# Patient Record
Sex: Female | Born: 1970
Health system: Southern US, Community
[De-identification: ages and names within clinical notes are randomized; demographics above are authoritative.]

## PROBLEM LIST (undated history)

## (undated) DIAGNOSIS — E559 Vitamin D deficiency, unspecified: Principal | ICD-10-CM

## (undated) DIAGNOSIS — R0602 Shortness of breath: Secondary | ICD-10-CM

## (undated) DIAGNOSIS — G4486 Cervicogenic headache: Secondary | ICD-10-CM

## (undated) DIAGNOSIS — R51 Headache: Secondary | ICD-10-CM

## (undated) DIAGNOSIS — M1990 Unspecified osteoarthritis, unspecified site: Secondary | ICD-10-CM

## (undated) DIAGNOSIS — T7840XA Allergy, unspecified, initial encounter: Secondary | ICD-10-CM

## (undated) DIAGNOSIS — Z800 Family history of malignant neoplasm of digestive organs: Secondary | ICD-10-CM

## (undated) DIAGNOSIS — K219 Gastro-esophageal reflux disease without esophagitis: Secondary | ICD-10-CM

## (undated) HISTORY — DX: Allergy, unspecified, initial encounter: T78.40XA

## (undated) HISTORY — DX: Headache: R51

## (undated) HISTORY — DX: Vitamin D deficiency, unspecified: E55.9

## (undated) HISTORY — DX: Gastro-esophageal reflux disease without esophagitis: K21.9

## (undated) HISTORY — DX: Family history of malignant neoplasm of digestive organs: Z80.0

## (undated) HISTORY — DX: Unspecified osteoarthritis, unspecified site: M19.90

## (undated) HISTORY — DX: Shortness of breath: R06.02

## (undated) HISTORY — DX: Cervicogenic headache: G44.86

---

## 2008-10-09 ENCOUNTER — Emergency Department (HOSPITAL_COMMUNITY): Admission: EM | Admit: 2008-10-09 | Discharge: 2008-10-09 | Payer: Self-pay | Admitting: Emergency Medicine

## 2009-05-28 ENCOUNTER — Encounter: Admission: RE | Admit: 2009-05-28 | Discharge: 2009-05-28 | Payer: Self-pay | Admitting: Obstetrics and Gynecology

## 2009-09-24 ENCOUNTER — Encounter: Payer: Self-pay | Admitting: Family Medicine

## 2009-10-22 ENCOUNTER — Ambulatory Visit: Payer: Self-pay | Admitting: Family Medicine

## 2009-10-22 DIAGNOSIS — K219 Gastro-esophageal reflux disease without esophagitis: Secondary | ICD-10-CM | POA: Insufficient documentation

## 2009-10-22 DIAGNOSIS — R109 Unspecified abdominal pain: Secondary | ICD-10-CM | POA: Insufficient documentation

## 2009-10-29 LAB — CONVERTED CEMR LAB
ALT: 10 units/L (ref 0–35)
AST: 14 units/L (ref 0–37)
Albumin: 3.9 g/dL (ref 3.5–5.2)
Amylase: 116 units/L (ref 27–131)
Basophils Absolute: 0 10*3/uL (ref 0.0–0.1)
CO2: 26 meq/L (ref 19–32)
Chloride: 106 meq/L (ref 96–112)
Eosinophils Absolute: 0.1 10*3/uL (ref 0.0–0.7)
GFR calc non Af Amer: 192.16 mL/min (ref >60)
Glucose, Bld: 77 mg/dL (ref 70–99)
HCT: 39.2 % (ref 36.0–46.0)
Hemoglobin: 13.6 g/dL (ref 12.0–15.0)
Monocytes Relative: 5.9 % (ref 3.0–12.0)
Neutro Abs: 4.9 10*3/uL (ref 1.4–7.7)
Neutrophils Relative %: 66 % (ref 43.0–77.0)
Sodium: 137 meq/L (ref 135–145)
Total Bilirubin: 0.5 mg/dL (ref 0.3–1.2)
WBC: 7.5 10*3/uL (ref 4.5–10.5)

## 2010-01-15 ENCOUNTER — Ambulatory Visit: Admit: 2010-01-15 | Payer: Self-pay | Admitting: Family Medicine

## 2010-01-27 ENCOUNTER — Encounter: Payer: Self-pay | Admitting: Pediatrics

## 2010-02-05 NOTE — Assessment & Plan Note (Signed)
Summary: new to estab/cbs   Vital Signs:  Patient profile:   40 year old female Menstrual status:  regular LMP:     08/21/2009 Height:      65 inches Weight:      128.2 pounds BMI:     21.41 Temp:     98.6 degrees F oral Pulse rate:   80 / minute Pulse rhythm:   regular BP sitting:   90 / 58  (left arm) Cuff size:   regular  Vitals Entered By: Almeta Monas CMA Duncan Dull) (October 22, 2009 2:16 PM) CC: NEW EST CARE.Marland KitchenMarland KitchenPt is pregnant due date is 05/26/2010 LMP (date): 08/21/2009     Menstrual Status regular Enter LMP: 08/21/2009   History of Present Illness: Pt here to establish and c/o reflux problems ---pt is [redacted] weeks pregnant and had problem with reflux even before she was pregnant.  Pt also c/o R LQ pain that has been there for years and she was told by Dr it was scar tissue.  Pt is very worried about pancreatic cancer.  Pt was taking Tums at suggestion of OB it helps but the flavor makes her sick.     Preventive Screening-Counseling & Management  Alcohol-Tobacco     Smoking Status: never  Caffeine-Diet-Exercise     Does Patient Exercise: yes      Drug Use:  no.    Current Medications (verified): 1)  Prenatal Vitamins 0.8 Mg Tabs (Prenatal Multivit-Min-Fe-Fa) .Marland Kitchen.. 1 By Mouth Once Daily 2)  Zantac 150 Mg Tabs (Ranitidine Hcl) .Marland Kitchen.. 1 By Mouth Two Times A Day  Allergies (verified): No Known Drug Allergies  Past History:  Family History: Last updated: 10/22/2009 Family History of Arthritis-- Mother Family History Diabetes 1st degree relative-- Mother Family History High cholesterol-- Mother Family History Hypertension--Mother Paunt--pancreatic ca  Social History: Last updated: 10/22/2009 Married Never Smoked Alcohol use-no Drug use-no Regular exercise-yes  Risk Factors: Exercise: yes (10/22/2009)  Risk Factors: Smoking Status: never (10/22/2009)  Past medical, surgical, family and social histories (including risk factors) reviewed for relevance to  current acute and chronic problems.  Past Medical History: GERD  Past Surgical History: Caesarean section--1998  Family History: Reviewed history and no changes required. Family History of Arthritis-- Mother Family History Diabetes 1st degree relative-- Mother Family History High cholesterol-- Mother Family History Hypertension--Mother Paunt--pancreatic ca  Social History: Reviewed history and no changes required. Married Never Smoked Alcohol use-no Drug use-no Regular exercise-yes Smoking Status:  never Drug Use:  no Does Patient Exercise:  yes  Review of Systems      See HPI  Physical Exam  General:  Well-developed,well-nourished,in no acute distress; alert,appropriate and cooperative throughout examination Lungs:  Normal respiratory effort, chest expands symmetrically. Lungs are clear to auscultation, no crackles or wheezes. Heart:  normal rate and no murmur.   Abdomen:  Bowel sounds positive,abdomen soft and non-tender without masses, organomegaly or hernias noted. Psych:  Oriented X3 and normally interactive.     Impression & Recommendations:  Problem # 1:  GERD (ICD-530.81)  Her updated medication list for this problem includes:    Zantac 150 Mg Tabs (Ranitidine hcl) .Marland Kitchen... 1 by mouth two times a day  Diagnostics Reviewed:  Discussed lifestyle modifications, diet, antacids/medications, and preventive measures.   Problem # 2:  ABDOMINAL PAIN OTHER SPECIFIED SITE (ICD-789.09) Pt is pregnant but is very anxious about potential other problems.   check labs encouraged pt to d/w with OB Orders: Venipuncture (86578) TLB-BMP (Basic Metabolic Panel-BMET) (80048-METABOL) TLB-CBC Platelet -  w/Differential (85025-CBCD) TLB-Hepatic/Liver Function Pnl (80076-HEPATIC) TLB-Amylase (82150-AMYL) TLB-Lipase (83690-LIPASE) Specimen Handling (40981)  Complete Medication List: 1)  Prenatal Vitamins 0.8 Mg Tabs (Prenatal multivit-min-fe-fa) .Marland Kitchen.. 1 by mouth once daily 2)   Zantac 150 Mg Tabs (Ranitidine hcl) .Marland Kitchen.. 1 by mouth two times a day Prescriptions: ZANTAC 150 MG TABS (RANITIDINE HCL) 1 by mouth two times a day  #60 x 5   Entered and Authorized by:   Loreen Freud DO   Signed by:   Loreen Freud DO on 10/22/2009   Method used:   Electronically to        [facility] [id number][facility] (retail)       [address]       [place], [place]  [address]       Ph: [phone]       Fax: [phone]   RxID:   [id number]    Orders Added: 1)  Venipuncture [40347] 2)  TLB-BMP (Basic Metabolic Panel-BMET) [80048-METABOL] 3)  TLB-CBC Platelet - w/Differential [85025-CBCD] 4)  TLB-Hepatic/Liver Function Pnl [80076-HEPATIC] 5)  TLB-Amylase [82150-AMYL] 6)  TLB-Lipase [83690-LIPASE] 7)  Specimen Handling [99000] 8)  New Patient Level III [42595]  Appended Document: new to estab/cbs  Laboratory Results   Urine Tests   Date/Time Reported: October 22, 2009 3:05 PM   Routine Urinalysis   Color: yellow Appearance: Clear Glucose: negative   (Normal Range: Negative) Bilirubin: negative   (Normal Range: Negative) Ketone: negative   (Normal Range: Negative) Spec. Gravity: >=1.030   (Normal Range: 1.003-1.035) Blood: negative   (Normal Range: Negative) pH: 5.0   (Normal Range: 5.0-8.0) Protein: negative   (Normal Range: Negative) Urobilinogen: negative   (Normal Range: 0-1) Nitrite: negative   (Normal Range: Negative) Leukocyte Esterace: negative   (Normal Range: Negative)    Comments: Floydene Flock  October 22, 2009 3:05 PM

## 2010-05-06 ENCOUNTER — Other Ambulatory Visit: Payer: Self-pay | Admitting: Obstetrics and Gynecology

## 2010-05-06 ENCOUNTER — Inpatient Hospital Stay (HOSPITAL_COMMUNITY)
Admission: AD | Admit: 2010-05-06 | Discharge: 2010-05-09 | DRG: 371 | Disposition: A | Discharge status: Home / Self Care | Payer: BC Managed Care – PPO | Source: Ambulatory Visit | Attending: Obstetrics and Gynecology | Admitting: Obstetrics and Gynecology

## 2010-05-06 DIAGNOSIS — O34219 Maternal care for unspecified type scar from previous cesarean delivery: Secondary | ICD-10-CM | POA: Diagnosis present

## 2010-05-06 DIAGNOSIS — O4100X0 Oligohydramnios, unspecified trimester, not applicable or unspecified: Principal | ICD-10-CM | POA: Diagnosis present

## 2010-05-06 DIAGNOSIS — O365990 Maternal care for other known or suspected poor fetal growth, unspecified trimester, not applicable or unspecified: Secondary | ICD-10-CM | POA: Diagnosis present

## 2010-05-06 DIAGNOSIS — O09529 Supervision of elderly multigravida, unspecified trimester: Secondary | ICD-10-CM | POA: Diagnosis present

## 2010-05-06 LAB — COMPREHENSIVE METABOLIC PANEL
AST: 20 U/L (ref 0–37)
CO2: 23 mEq/L (ref 19–32)
Chloride: 106 mEq/L (ref 96–112)
Creatinine, Ser: 0.3 mg/dL — ABNORMAL LOW (ref 0.4–1.2)
GFR calc Af Amer: >60 mL/min (ref >60)
GFR calc non Af Amer: >60 mL/min (ref >60)
Glucose, Bld: 88 mg/dL (ref 70–99)
Potassium: 3.7 mEq/L (ref 3.5–5.1)
Total Bilirubin: 0.3 mg/dL (ref 0.3–1.2)
Total Protein: 6.2 g/dL (ref 6.0–8.3)

## 2010-05-06 LAB — CBC
Hemoglobin: 13.5 g/dL (ref 12.0–15.0)
MCHC: 33.3 g/dL (ref 30.0–36.0)
MCV: 94.4 fL (ref 78.0–100.0)
RBC: 4.29 MIL/uL (ref 3.87–5.11)

## 2010-05-07 LAB — CBC
HCT: 35.2 % — ABNORMAL LOW (ref 36.0–46.0)
Hemoglobin: 11.5 g/dL — ABNORMAL LOW (ref 12.0–15.0)
MCH: 30.7 pg (ref 26.0–34.0)
MCHC: 32.7 g/dL (ref 30.0–36.0)
Platelets: 124 10*3/uL — ABNORMAL LOW (ref 150–400)

## 2010-05-14 NOTE — Discharge Summary (Signed)
[name], [name]                     ACCOUNT NO.:  [id number]  MEDICAL RECORD NO.:  [id number]           PATIENT TYPE:  I  LOCATION:  9121                          [facility]ILITY:  [facility]  PHYSICIAN:  Kalii Chesmore L. Prairie Stenberg, M.D.DATE OF BIRTH:  05-26-1970  DATE OF ADMISSION:  05/06/2010 DATE OF DISCHARGE:  05/09/2010                              DISCHARGE SUMMARY   ADMITTING DIAGNOSES: 1. Intrauterine pregnancy at 36-6/7 weeks' estimated gestational age. 2. Oligohydramnios. 3. Asymmetric intrauterine growth retardation.  DISCHARGE DIAGNOSES: 1. Status post low transverse cesarean section. 2. Viable female infant.  PROCEDURE:  Repeat low transverse cesarean section.  REASON FOR ADMISSION:  Please see written H and P.  HOSPITAL COURSE:  The patient is a 40 year old married [place] female gravida 7, para 1 who was admitted to [facility] at 38- 6/7 weeks' estimated gestational age.  Her prenatal care had beencomplicated by advanced maternal age with amniocentesis consistent with 46XY.  The patient also had a previous cesarean section in [place].  On the day of admission, the patient had been seen in the office with a fundal height measured 34 cm.  Subsequent ultrasound revealed vertex baby with estimated fetal weight of 5 pounds 1 ounce in the 9th percentile with amniotic fluid index measured 1.2 cm.  The patient denied any leakage of fluid.  Cervix was closed, thick, and high.  The patient was evaluated by Dr. Otho Perl, maternal fetal medicine consultant, and chart was reviewed as well as the ultrasound pictures. He did agree with recommendation for cesarean delivery at this time. The patient was then taken to the operating room where spinal anesthesia was administered without difficulty.  A low transverse incision was made with delivery of a viable female infant weighing 5 pounds 12 ounces with Apgars of 8 at 1 minute and 9 at 5 minutes.  Arterial cord pH was 7.34. The patient  tolerated the procedure well and was taken to the recovery room in stable condition.  On postoperative day 1, the patient was without complaint.  Vital signs were stable.  Abdomen soft.  Fundus firm and nontender.  Abdominal dressing was noted to have a scant amount of drainage on the bandage.  Foley was draining with adequate amounts of urine output.  Laboratory findings showed hemoglobin 11.5, platelet count 124,000, blood type is known to be O+.  On postoperative day 2, the patient did complain of some mild tenderness in the right margin of the incision.  Vital signs were stable.  Abdomen soft.  Abdominal dressing was clean, dry, and intact.  On postoperative day 3, the patient was without complaint.  Vital signs were stable.  Fundus was firm and nontender.  Incision was clean, dry, and intact.  Staples were removed, and the patient was later discharged home.  CONDITION ON DISCHARGE:  Stable.  DIET:  Regular as tolerated.  ACTIVITY:  No heavy lifting, no driving x2 weeks, no vaginal entry.  FOLLOWUP:  The patient is to follow up in the office in 1 week for an incision check.  She is to call for temperature greater than 100  degrees, persistent nausea, vomiting, heavy vaginal bleeding and/or redness or drainage from incisional site.  DISCHARGE MEDICATIONS: 1. Tylox #30 one q.4-6 hours. 2. Motrin 600 mg every 6 hours. 3. Prenatal vitamins one p.o. daily.     Julio Sicks, N.P.   ______________________________ Stann Mainland. Vincente Poli, M.D.    CC/MEDQ  D:  05/09/2010  T:  05/09/2010  Job:  [id number]  Electronically Signed by Julio Sicks N.P. on 05/10/2010 08:33:27 AM Electronically Signed by Marcelle Overlie M.D. on 05/14/2010 07:25:46 AM

## 2010-05-15 ENCOUNTER — Other Ambulatory Visit (HOSPITAL_COMMUNITY): Payer: BC Managed Care – PPO

## 2010-05-21 NOTE — Op Note (Signed)
[name], [name]                     ACCOUNT NO.:  [id number]  MEDICAL RECORD NO.:  [id number]           PATIENT TYPE:  I  LOCATION:  9121                          [facility]  PHYSICIAN:  Guy Sandifer. Henderson Cloud, M.D. DATE OF BIRTH:  [birth year]  DATE OF PROCEDURE:  05/06/2010 DATE OF DISCHARGE:                              OPERATIVE REPORT   PREOPERATIVE DIAGNOSES: 1. Intrauterine gestation at 36-6/7 weeks estimated gestational age. 2. Oligohydramnios. 3. Asymmetric intrauterine growth retardations.  POSTOPERATIVE DIAGNOSES: 1. Intrauterine gestation at 36-6/7 weeks estimated gestational age. 2. Oligohydramnios. 3. Asymmetric intrauterine growth retardations.  PROCEDURE:  Repeat low transverse cesarean section.  SURGEON:  Guy Sandifer. Henderson Cloud, MD  ANESTHESIA:  Brayton Caves, MD, spinal.  ESTIMATED BLOOD LOSS:  600 mL.  FINDINGS:  Viable female infant, Apgars of 8 and 9 at 1 and 5 minutes respectively.  Birth weight 5 pounds and 12 ounces.  Arterial cord pH of 7.34.  INDICATION AND CONSENT:  This patient is a 41 year old married [place] female, G7, P39, with an EDC of May 28, 2010, placing her at 36-6/7 weeks' estimated gestational age.  Prenatal care has been complicated by advanced maternal age with amniocentesis consistent with 46,XY.  Also, she has had a previous cesarean section in [place].  In the office today, fundal height measured 34 cm.  Subsequent ultrasound revealed a vertex baby with an estimated fetal weight of 5 pounds and 1 ounce at the 9th percentile and amniotic fluid index with the largest pocket being 1.2 cm.  The patient denied leaking of fluid.  Cervix is closed, thick, and high.  The chart as well as ultrasound pictures are reviewed with Dr. Otho Perl, maternal fetal medicine consultant.  He agrees with recommendation for delivery today via C-section.  This was discussed with the patient and her husband.  Potential risks and complications were reviewed  preoperatively including but limited to infection, organ damage, bleeding requiring transfusion of blood products with HIV and hepatitis acquisition, DVT, PE, and pneumonia.  All questions were answered and the patient acknowledges understanding.  PROCEDURE:  The patient was taken to the operating room where she was identified.  Spinal anesthetic was placed by Dr. Brayton Caves and she was placed in dorsal supine position with a 15-degree left lateral wedge.  A time-out was undertaken.  She was prepped.  Foley catheter was placed in the bladder to drain and she was draped in a sterile fashion. After testing for adequate spinal anesthesia, skin was entered through a Pfannenstiel incision and dissection was carried out in layers to the peritoneum.  Peritoneum was incised and extended superiorly and inferiorly.  Vesicouterine peritoneum was taken down cephalolaterally. The bladder flap was developed and the bladder blade was placed.  Uterus was incised in a low transverse manner.  The uterine cavity was entered bluntly with a hemostat.  The uterine incision was extended cephalolaterally with fingers.  There was very, very little clear amniotic fluid that was noted upon entry into the uterus.  Vertex then delivered with the aid of vacuum extractor with no pop-off elevated through the incision.  Remainder of the baby was delivered without difficulty.  Good cry and tone was noted.  Cord was clamped and cut and the baby was handed to the waiting pediatrics team.  Placenta was manually delivered intact and sent to the Pathology Lab.  Uterine cavity was clean.  Uterus was closed in 2 running locking imbricating layers of 0 Monocryl suture.  A single figure-of-eight was used in the center of the incision to achieve good hemostasis.  Tubes and ovaries were normal bilaterally.  Anterior peritoneum was closed in running fashion with 0 Monocryl suture which was also used to reapproximate the  pyramidalis muscle in the midline.  Anterior rectus fascia was closed in a running fashion with 0 looped PDS suture and the skin was closed with clips. All sponge, instrument, and needle counts were correct and the patient was transferred to the recovery room in stable condition.     Guy Sandifer Henderson Cloud, M.D.     JET/MEDQ  D:  05/06/2010  T:  05/07/2010  Job:  [id number]  Electronically Signed by Harold Hedge M.D. on 05/21/2010 01:50:47 PM

## 2010-05-23 ENCOUNTER — Inpatient Hospital Stay (HOSPITAL_COMMUNITY)
Admission: RE | Admit: 2010-05-23 | Payer: BC Managed Care – PPO | Source: Ambulatory Visit | Admitting: Obstetrics and Gynecology

## 2010-05-23 ENCOUNTER — Inpatient Hospital Stay (HOSPITAL_COMMUNITY): Admission: AD | Admit: 2010-05-23 | Payer: Self-pay | Source: Ambulatory Visit | Admitting: Obstetrics and Gynecology

## 2010-11-25 ENCOUNTER — Encounter: Payer: Self-pay | Admitting: Family Medicine

## 2010-11-26 ENCOUNTER — Encounter: Payer: Self-pay | Admitting: Gastroenterology

## 2010-11-26 ENCOUNTER — Encounter: Payer: Self-pay | Admitting: Family Medicine

## 2010-11-26 ENCOUNTER — Ambulatory Visit (INDEPENDENT_AMBULATORY_CARE_PROVIDER_SITE_OTHER): Payer: BC Managed Care – PPO | Admitting: Family Medicine

## 2010-11-26 VITALS — BP 100/60 | HR 81 | Temp 98.7°F | Wt 140.8 lb

## 2010-11-26 DIAGNOSIS — IMO0001 Reserved for inherently not codable concepts without codable children: Secondary | ICD-10-CM

## 2010-11-26 DIAGNOSIS — B353 Tinea pedis: Secondary | ICD-10-CM

## 2010-11-26 DIAGNOSIS — M791 Myalgia, unspecified site: Secondary | ICD-10-CM

## 2010-11-26 DIAGNOSIS — Z0000 Encounter for general adult medical examination without abnormal findings: Secondary | ICD-10-CM

## 2010-11-26 DIAGNOSIS — M549 Dorsalgia, unspecified: Secondary | ICD-10-CM

## 2010-11-26 DIAGNOSIS — B350 Tinea barbae and tinea capitis: Secondary | ICD-10-CM

## 2010-11-26 DIAGNOSIS — K219 Gastro-esophageal reflux disease without esophagitis: Secondary | ICD-10-CM

## 2010-11-26 LAB — H. PYLORI ANTIBODY, IGG: H Pylori IgG: NEGATIVE

## 2010-11-26 LAB — BASIC METABOLIC PANEL
BUN: 16 mg/dL (ref 6–23)
CO2: 25 mEq/L (ref 19–32)
Chloride: 108 mEq/L (ref 96–112)
GFR: 143.5 mL/min (ref >60.00)
Potassium: 4.2 mEq/L (ref 3.5–5.1)
Sodium: 142 mEq/L (ref 135–145)

## 2010-11-26 LAB — CBC WITH DIFFERENTIAL/PLATELET
Basophils Absolute: 0 10*3/uL (ref 0.0–0.1)
Basophils Relative: 0.4 % (ref 0.0–3.0)
Eosinophils Absolute: 0.1 10*3/uL (ref 0.0–0.7)
Eosinophils Relative: 1.4 % (ref 0.0–5.0)
HCT: 43.6 % (ref 36.0–46.0)
Lymphs Abs: 2 10*3/uL (ref 0.7–4.0)
Monocytes Absolute: 0.5 10*3/uL (ref 0.1–1.0)
Neutro Abs: 3.2 10*3/uL (ref 1.4–7.7)
Neutrophils Relative %: 55.2 % (ref 43.0–77.0)

## 2010-11-26 LAB — HEPATIC FUNCTION PANEL
Bilirubin, Direct: 0 mg/dL (ref 0.0–0.3)
Total Bilirubin: 0.7 mg/dL (ref 0.3–1.2)

## 2010-11-26 LAB — SEDIMENTATION RATE: Sed Rate: 10 mm/hr (ref 0–22)

## 2010-11-26 MED ORDER — RANITIDINE HCL 150 MG PO TABS: 150.0000 mg | ORAL_TABLET | Freq: Two times a day (BID) | ORAL | Status: DC

## 2010-11-26 MED ORDER — KETOCONAZOLE 2 % EX SHAM: MEDICATED_SHAMPOO | CUTANEOUS | Status: AC

## 2010-11-26 MED ORDER — NAFTIFINE HCL 1 % EX CREA: TOPICAL_CREAM | Freq: Every day | CUTANEOUS | Status: AC

## 2010-11-26 MED ORDER — PRENATAL 1 30-0.975-200 MG PO CAPS: 1.0000 | ORAL_CAPSULE | Freq: Every day | ORAL | Status: DC

## 2010-11-26 NOTE — Patient Instructions (Signed)
Diet for GERD or PUD Nutrition therapy can help ease the discomfort of gastroesophageal reflux disease (GERD) and peptic ulcer disease (PUD).  HOME CARE INSTRUCTIONS   Eat your meals slowly, in a relaxed setting.   Eat 5 to 6 small meals per day.   If a food causes distress, stop eating it for a period of time.  FOODS TO AVOID  Coffee, regular or decaffeinated.   Cola beverages, regular or low calorie.   Tea, regular or decaffeinated.   Pepper.   Cocoa.   High fat foods, including meats.   Butter, margarine, hydrogenated oil (trans fats).   Peppermint or spearmint (if you have GERD).   Fruits and vegetables if not tolerated.   Alcohol.   Nicotine (smoking or chewing). This is one of the most potent stimulants to acid production in the gastrointestinal tract.   Any food that seems to aggravate your condition.  If you have questions regarding your diet, ask your caregiver or a registered dietitian. TIPS  Lying flat may make symptoms worse. Keep the head of your bed raised 6 to 9 inches (15 to 23 cm) by using a foam wedge or blocks under the legs of the bed.   Do not lay down until 3 hours after eating a meal.   Daily physical activity may help reduce symptoms.  MAKE SURE YOU:   Understand these instructions.   Will watch your condition.   Will get help right away if you are not doing well or get worse.  Document Released: 12/23/2004 Document Revised: 09/04/2010 Document Reviewed: 05/08/2008 [employer] Patient Information 2012 [employer]. 

## 2010-11-26 NOTE — Progress Notes (Signed)
  Subjective:     [name] is an 40 y.o. female who presents for evaluation of heartburn. This has been associated with belching, fullness after meals, heartburn, laryngitis, nocturnal burning and upper abdominal discomfort. She denies chest pain, choking on food, difficulty swallowing, dysphagia, hematemesis and melena. Symptoms have been present for several years. She denies dysphagia. She has not lost weight. She denies melena, hematochezia, hematemesis, and coffee ground emesis. Medical therapy in the past has included: antacids and H2 antagonists.  Pt also c/o multiple joint pains in pelvis and hands since she delivered her baby.  No other complaints.  No numbness in hands--just pain The following portions of the patient's history were reviewed and updated as appropriate: allergies, current medications, past family history, past medical history, past social history, past surgical history and problem list.  Review of Systems Pertinent items are noted in HPI.   Objective:     BP 100/60  Pulse 81  Temp(Src) 98.7 F (37.1 C) (Oral)  Wt 140 lb 12.8 oz (63.866 kg)  SpO2 99% General appearance: alert, cooperative, appears stated age and no distress Abdomen: soft, non-tender; bowel sounds normal; no masses,  no organomegaly Extremities: extremities normal, atraumatic, no cyanosis or edema Lymph nodes: Cervical, supraclavicular, and axillary nodes normal. Neurologic: Alert and oriented X 3, normal strength and tone. Normal symmetric reflexes. Normal coordination and gait skin---0-+ tinea on feet and scalp  M/s-- no swelling in wrists or hands , no dec rom Assessment:    Gastroesophageal Reflux Disease,  --- zantac 150 bid--- pt is still breast feeding-- pt requesting GI Tinea capitis--- nizoral shampoo Tinea pedis---   naftin cream     Plan:    Will start a trial of H2 antagonists. Follow up in 2 weeks or sooner as needed.

## 2010-11-27 LAB — ANA: Anti Nuclear Antibody(ANA): NEGATIVE

## 2010-11-27 LAB — RHEUMATOID FACTOR: Rhuematoid fact SerPl-aCnc: <10 IU/mL (ref <=14)

## 2010-11-29 LAB — VITAMIN D 1,25 DIHYDROXY
Vitamin D 1, 25 (OH)2 Total: 63 pg/mL (ref 18–72)
Vitamin D2 1, 25 (OH)2: <8 pg/mL

## 2010-12-07 HISTORY — PX: ESOPHAGOGASTRODUODENOSCOPY: SHX1529

## 2010-12-07 HISTORY — PX: COLONOSCOPY: SHX174

## 2010-12-12 ENCOUNTER — Ambulatory Visit: Payer: BC Managed Care – PPO | Admitting: Gastroenterology

## 2010-12-17 ENCOUNTER — Ambulatory Visit (INDEPENDENT_AMBULATORY_CARE_PROVIDER_SITE_OTHER): Payer: BC Managed Care – PPO | Admitting: Gastroenterology

## 2010-12-17 ENCOUNTER — Encounter: Payer: Self-pay | Admitting: Gastroenterology

## 2010-12-17 DIAGNOSIS — K219 Gastro-esophageal reflux disease without esophagitis: Secondary | ICD-10-CM

## 2010-12-17 DIAGNOSIS — K5904 Chronic idiopathic constipation: Secondary | ICD-10-CM | POA: Insufficient documentation

## 2010-12-17 DIAGNOSIS — K5909 Other constipation: Secondary | ICD-10-CM

## 2010-12-17 DIAGNOSIS — Z800 Family history of malignant neoplasm of digestive organs: Secondary | ICD-10-CM | POA: Insufficient documentation

## 2010-12-17 MED ORDER — SUCRALFATE 1 GM/10ML PO SUSP: ORAL | Status: DC

## 2010-12-17 MED ORDER — PEG-KCL-NACL-NASULF-NA ASC-C 100 G PO SOLR: 1.0000 | Freq: Once | ORAL | Status: DC

## 2010-12-17 MED ORDER — ESOMEPRAZOLE MAGNESIUM 40 MG PO CPDR: 40.0000 mg | DELAYED_RELEASE_CAPSULE | Freq: Every day | ORAL | Status: DC

## 2010-12-17 NOTE — Patient Instructions (Signed)
Your procedure has been scheduled for 12/25/2010, please follow the seperate instructions.  Propofol handout given.  Today you watched the Genella Rife movie in the office. Take Nexium 30 min before supper every day.  Take Carafate 1 tablespoon every 2-4 hours as needed. Your prescription(s) have been sent to you pharmacy.   Diet for GERD or PUD Nutrition therapy can help ease the discomfort of gastroesophageal reflux disease (GERD) and peptic ulcer disease (PUD).  HOME CARE INSTRUCTIONS   Eat your meals slowly, in a relaxed setting.   Eat 5 to 6 small meals per day.   If a food causes distress, stop eating it for a period of time.  FOODS TO AVOID  Coffee, regular or decaffeinated.   Cola beverages, regular or low calorie.   Tea, regular or decaffeinated.   Pepper.   Cocoa.   High fat foods, including meats.   Butter, margarine, hydrogenated oil (trans fats).   Peppermint or spearmint (if you have GERD).   Fruits and vegetables if not tolerated.   Alcohol.   Nicotine (smoking or chewing). This is one of the most potent stimulants to acid production in the gastrointestinal tract.   Any food that seems to aggravate your condition.  If you have questions regarding your diet, ask your caregiver or a registered dietitian. TIPS  Lying flat may make symptoms worse. Keep the head of your bed raised 6 to 9 inches (15 to 23 cm) by using a foam wedge or blocks under the legs of the bed.   Do not lay down until 3 hours after eating a meal.   Daily physical activity may help reduce symptoms.  MAKE SURE YOU:   Understand these instructions.   Will watch your condition.   Will get help right away if you are not doing well or get worse.  Document Released: 12/23/2004 Document Revised: 09/04/2010 Document Reviewed: 05/08/2008 [facility] Patient Information 2012 [place], [place].  Colonoscopy A colonoscopy is an exam to evaluate your entire colon. In this exam, your colon is cleansed.  A long fiberoptic tube is inserted through your rectum and into your colon. The fiberoptic scope (endoscope) is a long bundle of enclosed and very flexible fibers. These fibers transmit light to the area examined and send images from that area to your caregiver. Discomfort is usually minimal. You may be given a drug to help you sleep (sedative) during or prior to the procedure. This exam helps to detect lumps (tumors), polyps, inflammation, and areas of bleeding. Your caregiver may also take a small piece of tissue (biopsy) that will be examined under a microscope. LET YOUR CAREGIVER KNOW ABOUT:   Allergies to food or medicine.   Medicines taken, including vitamins, herbs, eyedrops, over-the-counter medicines, and creams.   Use of steroids (by mouth or creams).   Previous problems with anesthetics or numbing medicines.   History of bleeding problems or blood clots.   Previous surgery.   Other health problems, including diabetes and kidney problems.   Possibility of pregnancy, if this applies.  BEFORE THE PROCEDURE   A clear liquid diet may be required for 2 days before the exam.   Ask your caregiver about changing or stopping your regular medications.   Liquid injections (enemas) or laxatives may be required.   A large amount of electrolyte solution may be given to you to drink over a short period of time. This solution is used to clean out your colon.   You should be present 60 minutes prior to  your procedure or as directed by your caregiver.  AFTER THE PROCEDURE   If you received a sedative or pain relieving medication, you will need to arrange for someone to drive you home.   Occasionally, there is a little blood passed with the first bowel movement. Do not be concerned.  FINDING OUT THE RESULTS OF YOUR TEST Not all test results are available during your visit. If your test results are not back during the visit, make an appointment with your caregiver to find out the results. Do  not assume everything is normal if you have not heard from your caregiver or the medical facility. It is important for you to follow up on all of your test results. HOME CARE INSTRUCTIONS   It is not unusual to pass moderate amounts of gas and experience mild abdominal cramping following the procedure. This is due to air being used to inflate your colon during the exam. Walking or a warm pack on your belly (abdomen) may help.   You may resume all normal meals and activities after sedatives and medicines have worn off.   Only take over-the-counter or prescription medicines for pain, discomfort, or fever as directed by your caregiver. Do not use aspirin or blood thinners if a biopsy was taken. Consult your caregiver for medicine usage if biopsies were taken.  SEEK IMMEDIATE MEDICAL CARE IF:   You have a fever.   You pass large blood clots or fill a toilet with blood following the procedure. This may also occur 10 to 14 days following the procedure. This is more likely if a biopsy was taken.   You develop abdominal pain that keeps getting worse and cannot be relieved with medicine.  Document Released: 12/21/1999 Document Revised: 09/04/2010 Document Reviewed: 08/05/2007 [facility] Patient Information 2012 [place], [place].  Upper GI Endoscopy Upper GI endoscopy means using a flexible scope to look at the esophagus, stomach, and upper small bowel. This is done to make a diagnosis in people with heartburn, abdominal pain, or abnormal bleeding. Sometimes an endoscope is needed to remove foreign bodies or food that become stuck in the esophagus; it can also be used to take biopsy samples. For the best results, do not eat or drink for 8 hours before having your upper endoscopy.  To perform the endoscopy, you will probably be sedated and your throat will be numbed with a special spray. The endoscope is then slowly passed down your throat (this will not interfere with your breathing). An endoscopy exam takes  15 to 30 minutes to complete and there is no real pain. Patients rarely remember much about the procedure. The results of the test may take several days if a biopsy or other test is taken.  You may have a sore throat after an endoscopy exam. Serious complications are very rare. Stick to liquids and soft foods until your pain is better. Do not drive a car or operate any dangerous equipment for at least 24 hours after being sedated. SEEK IMMEDIATE MEDICAL CARE IF:   You have severe throat pain.   You have shortness of breath.   You have bleeding problems.   You have a fever.   You have difficulty recovering from your sedation.  Document Released: 01/31/2004 Document Revised: 09/04/2010 Document Reviewed: 12/26/2007 [facility] Patient Information 2012 [place], [place].

## 2010-12-17 NOTE — Progress Notes (Signed)
History of Present Illness:  This is a very pleasant 40 year old Asian female referred by [facility] for evaluation of refractory acid reflux symptoms. This patient has had regurgitation and burning substernal chest pain since 2006, and apparently had a negative endoscopy in 2008 in [place]. She has daytime and nighttime acid reflux with rather marked substernal chest pain that radiates into her back, causes throat clearing and globus sensation,but no chronic cough. She currently is breast-feeding her infant ,and is taking when necessary Zantac without symptomatic relief. She is concerned about a family history of esophageal cancer in her father who recently underwent esophagectomy. She also is concerned because she had an aunt who died of colon cancer at age 39. Patient has mild constipation and has a history of a" growth" in her anal canal. She has not had previous colonoscopy or barium studies.  Her appetite is good and her weight is stable. She denies a specific food intolerances, hepatobiliary complaints or systemic problems such as fever or chills. She did have hepatitis in grammar school, but has had no chronic liver disease, and review of her labs showed normal liver function tests and CBC.  I have reviewed this patient's present history, medical and surgical past history, allergies and medications.     ROS: The remainder of the 10 point ROS is negative... no palpitations, shortness of breath, chest pain with exertion, or other cardiovascular or pulmonary complaints. Patient does not smoke or use ethanol. She has had a previous cesarean section and has 2 children ,a 7 month infant and a  30 year old . No symptoms of collagen vascular disease or Raynaud phenomenon. She does complain of low back pain has had a negative serologic workup for autoimmune problems and rheumatoid arthritis. She denies any current gynecologic problems. Also the patient complains of intermittent right lower quadrant pain, and  she says she sometimes can feel a mass in that area.     Physical Exam: General well developed well nourished patient in no acute distress, appearing their stated age Eyes PERRLA, no icterus, fundoscopic exam per opthamologist Skin no lesions noted Neck supple, no adenopathy, no thyroid enlargement, no tenderness Chest clear to percussion and auscultation Heart no significant murmurs, gallops or rubs noted Abdomen no hepatosplenomegaly masses or tenderness, BS normal. An anterior skin tag is noted without evidence of fistulization or incision. Digital exam was not performed. Suprapubic linear scar noted. I cannot feel any masses or tenderness with repeated examinations. Rectal inspection normal no fissures, or fistulae noted.  Extremities no acute joint lesions, edema, phlebitis or evidence of cellulitis. Neurologic patient oriented x 3, cranial nerves intact, no focal neurologic deficits noted. Psychological mental status normal and normal affect.  Assessment and plan: Severe acid reflux, rule out Barrett's esophagus with her family history and the longevity of her problem. I have placed her on PPI therapy with AcipHex 20 mg before evening meal since the majority of her symptoms seem to be nocturnal. Reflux regime reviewed in detail, and she saw her patient education video on GERD. Because her family history of colon cancer at a young age, have also scheduled her for colonoscopy at the time of endoscopy. Samples of AcipHex have been prescribed. I also u's when necessary Carafate suspension 1 tablespoon every 4 hours as needed.  No diagnosis found.

## 2010-12-25 ENCOUNTER — Ambulatory Visit (AMBULATORY_SURGERY_CENTER): Payer: BC Managed Care – PPO | Admitting: Gastroenterology

## 2010-12-25 ENCOUNTER — Encounter: Payer: Self-pay | Admitting: Gastroenterology

## 2010-12-25 DIAGNOSIS — R0789 Other chest pain: Secondary | ICD-10-CM

## 2010-12-25 DIAGNOSIS — K5909 Other constipation: Secondary | ICD-10-CM

## 2010-12-25 DIAGNOSIS — K219 Gastro-esophageal reflux disease without esophagitis: Secondary | ICD-10-CM

## 2010-12-25 DIAGNOSIS — Z800 Family history of malignant neoplasm of digestive organs: Secondary | ICD-10-CM

## 2010-12-25 DIAGNOSIS — K209 Esophagitis, unspecified without bleeding: Secondary | ICD-10-CM

## 2010-12-25 DIAGNOSIS — K5904 Chronic idiopathic constipation: Secondary | ICD-10-CM

## 2010-12-25 DIAGNOSIS — D130 Benign neoplasm of esophagus: Secondary | ICD-10-CM

## 2010-12-25 DIAGNOSIS — Z1211 Encounter for screening for malignant neoplasm of colon: Secondary | ICD-10-CM

## 2010-12-25 MED ORDER — SODIUM CHLORIDE 0.9 % IV SOLN: 500.0000 mL | INTRAVENOUS | Status: DC

## 2010-12-25 NOTE — Progress Notes (Signed)
No complaints noted in the recovery room. Maw  Patient did not have preoperative order for IV antibiotic SSI prophylaxis. (G8918) Patient did not experience any of the following events: a burn prior to discharge; a fall within the facility; wrong site/side/patient/procedure/implant event; or a hospital transfer or hospital admission upon discharge from the facility. (G8907)  

## 2010-12-25 NOTE — Patient Instructions (Addendum)
See the picture page for your findings from your exam today.  Follow the green and blue discharge instruction sheets the rest of the day.  Please call if any questions or concerns. Resume your prior medications today except stop your zantac due to rash and continue taking the carafate.

## 2010-12-25 NOTE — Op Note (Signed)
[facility] [address] [place], [place]  [address]  COLONOSCOPY PROCEDURE REPORT  PATIENT:  [name], [name]  MR#:  [id number] BIRTHDATE:  September 30, 1970, 40 yrs. old  GENDER:  female ENDOSCOPIST:  Vania Rea. Jarold Motto, MD, [facility] REF. BY:  Loreen Freud, DO PROCEDURE DATE:  12/25/2010 PROCEDURE:  Higher-risk screening colonoscopy G0105  ASA CLASS:  Class I INDICATIONS:  family history of colon cancer MEDICATIONS:   propofol (Diprivan) 200 mg IV  DESCRIPTION OF PROCEDURE:   After the risks and benefits and of the procedure were explained, informed consent was obtained. Digital rectal exam was performed and revealed no abnormalities. The LB 180AL [id number] endoscope was introduced through the anus and advanced to the cecum, which was identified by both the appendix and ileocecal valve.  The quality of the prep was excellent, using MoviPrep.  The instrument was then slowly withdrawn as the colon was fully examined. <<PROCEDUREIMAGES>>  FINDINGS:  No polyps or cancers were seen.  This was otherwise a normal examination of the colon.   Retroflexed views in the rectum revealed no abnormalities.    The scope was then withdrawn from the patient and the procedure completed.  COMPLICATIONS:  None ENDOSCOPIC IMPRESSION: 1) No polyps or cancers 2) Otherwise normal examination RECOMMENDATIONS: 1) Given your significant family history of colon cancer, you should have a repeat colonoscopy in 5 years  REPEAT EXAM:  No  ______________________________ Vania Rea. Jarold Motto, MD, [facility]  CC:  n. eSIGNED:   Vania Rea. Patterson at 12/25/2010 03:07 PM  Morrell Riddle, [id number]

## 2010-12-26 ENCOUNTER — Telehealth: Payer: Self-pay

## 2010-12-26 DIAGNOSIS — K209 Esophagitis, unspecified without bleeding: Secondary | ICD-10-CM

## 2010-12-26 DIAGNOSIS — R109 Unspecified abdominal pain: Secondary | ICD-10-CM

## 2010-12-26 LAB — HELICOBACTER PYLORI SCREEN-BIOPSY: UREASE: NEGATIVE

## 2010-12-26 NOTE — Telephone Encounter (Signed)
No answer Rung 12 times

## 2010-12-26 NOTE — Op Note (Signed)
[facility] [address] [place], [place]  [address]  ENDOSCOPY PROCEDURE REPORT  PATIENT:  [name], [name]  MR#:  [id number] BIRTHDATE:  09/13/1970, 40 yrs. old  GENDER:  female  ENDOSCOPIST:  Vania Rea. Jarold Motto, MD, [facility] Referred by:  Loreen Freud, DO  PROCEDURE DATE:  12/25/2010 PROCEDURE:  EGD with biopsy for H. pylori 29562 ASA CLASS:  Class II INDICATIONS:  chest pain, heartburn, GERD  MEDICATIONS:   There was residual sedation effect present from prior procedure., propofol (Diprivan) 50 mg IV TOPICAL ANESTHETIC:  DESCRIPTION OF PROCEDURE:   After the risks and benefits of the procedure were explained, informed consent was obtained.  The LB [id number]-H180 [id number] endoscope was introduced through the mouth and advanced to the second portion of the duodenum.  The instrument was slowly withdrawn as the mucosa was fully examined. <<PROCEDUREIMAGES>>  The upper, middle, and distal third of the esophagus were carefully inspected and no abnormalities were noted. The z-line was well seen at the GEJ. The endoscope was pushed into the fundus which was normal including a retroflexed view. The antrum,gastric body, first and second part of the duodenum were unremarkable. CLO BX. AND ESOPHAGEAL BIOPSIES DONE.  The esophagus and gastroesophageal junction were completely normal in appearance. NO EROSIONS,BARRETT'S,STRICTURE.BIOPSIES DONE.    Retroflexed views revealed no abnormalities.    The scope was then withdrawn from the patient and the procedure completed.  COMPLICATIONS:  None  ENDOSCOPIC IMPRESSION: 1) Normal EGD 2) Normal esophagus R/O EOSINOPHILIC ESOPHAGITIS VS ATYPICAL GERD. RECOMMENDATIONS: 1) Await biopsy results STOP PPI PER NEW RASH.  ______________________________ Vania Rea. Jarold Motto, MD, [facility]  CC:  n. eSIGNED:   Vania Rea. Patterson at 12/25/2010 03:13 PM  Morrell Riddle, [phone]

## 2011-01-08 ENCOUNTER — Encounter: Payer: Self-pay | Admitting: Gastroenterology

## 2011-03-26 ENCOUNTER — Other Ambulatory Visit: Payer: Self-pay | Admitting: Obstetrics and Gynecology

## 2011-03-26 DIAGNOSIS — N63 Unspecified lump in unspecified breast: Secondary | ICD-10-CM

## 2011-04-02 ENCOUNTER — Ambulatory Visit
Admission: RE | Admit: 2011-04-02 | Discharge: 2011-04-02 | Disposition: A | Discharge status: Home / Self Care | Payer: BC Managed Care – PPO | Source: Ambulatory Visit | Attending: Obstetrics and Gynecology | Admitting: Obstetrics and Gynecology

## 2011-04-02 DIAGNOSIS — N63 Unspecified lump in unspecified breast: Secondary | ICD-10-CM

## 2012-03-04 ENCOUNTER — Other Ambulatory Visit (HOSPITAL_COMMUNITY): Payer: Self-pay | Admitting: Obstetrics and Gynecology

## 2012-03-04 DIAGNOSIS — Z139 Encounter for screening, unspecified: Secondary | ICD-10-CM

## 2012-04-02 ENCOUNTER — Inpatient Hospital Stay (HOSPITAL_COMMUNITY): Admission: RE | Admit: 2012-04-02 | Payer: BC Managed Care – PPO | Source: Ambulatory Visit

## 2012-04-02 ENCOUNTER — Other Ambulatory Visit: Payer: Self-pay | Admitting: Obstetrics and Gynecology

## 2012-04-02 DIAGNOSIS — Z1231 Encounter for screening mammogram for malignant neoplasm of breast: Secondary | ICD-10-CM

## 2012-04-19 ENCOUNTER — Ambulatory Visit: Payer: BC Managed Care – PPO

## 2012-04-19 ENCOUNTER — Other Ambulatory Visit: Payer: Self-pay | Admitting: Obstetrics and Gynecology

## 2012-04-19 DIAGNOSIS — N6310 Unspecified lump in the right breast, unspecified quadrant: Secondary | ICD-10-CM

## 2012-04-30 ENCOUNTER — Ambulatory Visit
Admission: RE | Admit: 2012-04-30 | Discharge: 2012-04-30 | Disposition: A | Discharge status: Home / Self Care | Payer: BC Managed Care – PPO | Source: Ambulatory Visit | Attending: Obstetrics and Gynecology | Admitting: Obstetrics and Gynecology

## 2012-04-30 ENCOUNTER — Other Ambulatory Visit: Payer: Self-pay | Admitting: Obstetrics and Gynecology

## 2012-04-30 DIAGNOSIS — N6310 Unspecified lump in the right breast, unspecified quadrant: Secondary | ICD-10-CM

## 2012-07-30 ENCOUNTER — Ambulatory Visit
Admission: RE | Admit: 2012-07-30 | Discharge: 2012-07-30 | Disposition: A | Discharge status: Home / Self Care | Payer: BC Managed Care – PPO | Source: Ambulatory Visit | Attending: Obstetrics and Gynecology | Admitting: Obstetrics and Gynecology

## 2012-07-30 DIAGNOSIS — N6310 Unspecified lump in the right breast, unspecified quadrant: Secondary | ICD-10-CM

## 2012-08-09 ENCOUNTER — Telehealth: Payer: Self-pay | Admitting: Family Medicine

## 2012-08-09 NOTE — Telephone Encounter (Signed)
Make Appointment  Transferred to scheduler for appointment.

## 2012-08-09 NOTE — Telephone Encounter (Signed)
Patient Information:  Caller Name: Briannia  Phone: [phone]  Patient: [name]  Gender: Female  DOB: 02/22/1970  Age: 42 Years  PCP: Lelon Perla.  Pregnant: No  Office Follow Up:  Does the office need to follow up with this patient?: No  Instructions For The Office: N/A  RN Note:  Educated about Hepatitis C per [employer].  Transferred to office for appointment to be tested for Hepatitis C and appointment for visiting sister who is not established patient of practice.  Symptoms  Reason For Call & Symptoms: Concerned about risk for Hepatitis C but asymptomatic; visiting sister diagnosed with + Hepatitis C.    Reviewed Health History In EMR: Yes  Reviewed Medications In EMR: Yes  Reviewed Allergies In EMR: Yes  Reviewed Surgeries / Procedures: Yes  Date of Onset of Symptoms: 08/09/2012 OB / GYN:  LMP: 07/31/2012  Guideline(s) Used:  No Protocol Available - Information Only  Disposition Per Guideline:   Home Care  Reason For Disposition Reached:   Information only question and nurse able to answer  Advice Given:  N/A  RN Overrode Recommendation:  Make Appointment  Transferred to scheduler for appointment.

## 2012-08-20 ENCOUNTER — Ambulatory Visit: Payer: BC Managed Care – PPO | Admitting: Family Medicine

## 2012-09-21 ENCOUNTER — Encounter: Payer: BC Managed Care – PPO | Admitting: Family Medicine

## 2012-09-21 DIAGNOSIS — Z0289 Encounter for other administrative examinations: Secondary | ICD-10-CM

## 2012-11-04 ENCOUNTER — Ambulatory Visit (INDEPENDENT_AMBULATORY_CARE_PROVIDER_SITE_OTHER): Payer: BC Managed Care – PPO | Admitting: Family Medicine

## 2012-11-04 ENCOUNTER — Encounter: Payer: Self-pay | Admitting: Family Medicine

## 2012-11-04 VITALS — BP 97/62 | HR 93 | Temp 99.8°F | Resp 18 | Ht 64.5 in | Wt 127.0 lb

## 2012-11-04 DIAGNOSIS — Z202 Contact with and (suspected) exposure to infections with a predominantly sexual mode of transmission: Secondary | ICD-10-CM

## 2012-11-04 DIAGNOSIS — J029 Acute pharyngitis, unspecified: Secondary | ICD-10-CM

## 2012-11-04 DIAGNOSIS — Z2089 Contact with and (suspected) exposure to other communicable diseases: Secondary | ICD-10-CM

## 2012-11-04 DIAGNOSIS — Z7251 High risk heterosexual behavior: Secondary | ICD-10-CM

## 2012-11-04 DIAGNOSIS — R21 Rash and other nonspecific skin eruption: Secondary | ICD-10-CM

## 2012-11-04 DIAGNOSIS — R509 Fever, unspecified: Secondary | ICD-10-CM

## 2012-11-04 DIAGNOSIS — N898 Other specified noninflammatory disorders of vagina: Secondary | ICD-10-CM

## 2012-11-04 LAB — HIV ANTIBODY (ROUTINE TESTING W REFLEX): HIV: NONREACTIVE
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
## 2012-11-04 NOTE — Assessment & Plan Note (Signed)
Likely coxsackie A virus (Hand, Foot, and Mouth disease). However, since this rash is on her palms and soles almost exclusively and she has none in her mouth, PLUS her hx of exposure to an unknown STD per her report today, will check RPR to make sure this is not secondary syphilis. Will also check GC/Chl and an HIV test. Will do acute coxsackie A virus titers and check convalescent titers in 6-8 wks since there is uncertainty of dx at this time.

## 2012-11-05 LAB — GC/CHLAMYDIA PROBE AMP, URINE
Chlamydia, Swab/Urine, PCR: NEGATIVE
GC Probe Amp, Urine: NEGATIVE

## 2012-11-06 LAB — CULTURE, GROUP A STREP

## 2013-05-17 ENCOUNTER — Other Ambulatory Visit: Payer: Self-pay | Admitting: Obstetrics and Gynecology

## 2013-05-17 ENCOUNTER — Other Ambulatory Visit: Payer: Self-pay

## 2013-05-17 DIAGNOSIS — N6009 Solitary cyst of unspecified breast: Secondary | ICD-10-CM

## 2013-05-17 DIAGNOSIS — Z1231 Encounter for screening mammogram for malignant neoplasm of breast: Secondary | ICD-10-CM

## 2013-05-27 ENCOUNTER — Ambulatory Visit
Admission: RE | Admit: 2013-05-27 | Discharge: 2013-05-27 | Disposition: A | Discharge status: Home / Self Care | Payer: BC Managed Care – PPO | Source: Ambulatory Visit | Attending: Obstetrics and Gynecology | Admitting: Obstetrics and Gynecology

## 2013-05-27 ENCOUNTER — Encounter (INDEPENDENT_AMBULATORY_CARE_PROVIDER_SITE_OTHER): Payer: Self-pay

## 2013-05-27 DIAGNOSIS — N6009 Solitary cyst of unspecified breast: Secondary | ICD-10-CM

## 2014-03-25 ENCOUNTER — Emergency Department (HOSPITAL_COMMUNITY): Payer: BLUE CROSS/BLUE SHIELD

## 2014-03-25 ENCOUNTER — Encounter (HOSPITAL_COMMUNITY): Payer: Self-pay | Admitting: Emergency Medicine

## 2014-03-25 ENCOUNTER — Emergency Department (HOSPITAL_COMMUNITY)
Admission: EM | Admit: 2014-03-25 | Discharge: 2014-03-25 | Disposition: A | Discharge status: Home / Self Care | Payer: BLUE CROSS/BLUE SHIELD | Attending: Emergency Medicine | Admitting: Emergency Medicine

## 2014-03-25 DIAGNOSIS — S0990XA Unspecified injury of head, initial encounter: Secondary | ICD-10-CM | POA: Insufficient documentation

## 2014-03-25 DIAGNOSIS — Y998 Other external cause status: Secondary | ICD-10-CM | POA: Diagnosis not present

## 2014-03-25 DIAGNOSIS — S199XXA Unspecified injury of neck, initial encounter: Secondary | ICD-10-CM | POA: Diagnosis not present

## 2014-03-25 DIAGNOSIS — Z880 Allergy status to penicillin: Secondary | ICD-10-CM | POA: Diagnosis not present

## 2014-03-25 DIAGNOSIS — Z8719 Personal history of other diseases of the digestive system: Secondary | ICD-10-CM | POA: Insufficient documentation

## 2014-03-25 DIAGNOSIS — M542 Cervicalgia: Secondary | ICD-10-CM

## 2014-03-25 DIAGNOSIS — Y92410 Unspecified street and highway as the place of occurrence of the external cause: Secondary | ICD-10-CM | POA: Insufficient documentation

## 2014-03-25 DIAGNOSIS — Y9389 Activity, other specified: Secondary | ICD-10-CM | POA: Diagnosis not present

## 2014-03-25 MED ORDER — ACETAMINOPHEN 325 MG PO TABS
650.0000 mg | ORAL_TABLET | Freq: Once | ORAL | Status: AC
  Administered 2014-03-25: 650 mg via ORAL
  Filled 2014-03-25: qty 2

## 2014-03-25 NOTE — ED Notes (Signed)
Pt ambulated in hall. HR 76. o2 sat 99. BP 102/53
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
## 2014-03-25 NOTE — ED Notes (Signed)
Pt denied any dizziness while ambulating
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
## 2014-03-25 NOTE — Discharge Instructions (Signed)
Please call your doctor for a followup appointment within 24-48 hours. When you talk to your doctor please let them know that you were seen in the emergency department and have them acquire all of your records so that they can discuss the findings with you and formulate a treatment plan to fully care for your new and ongoing problems. Please call and set-up an appointment with your primary care provider  Please follow up with Orthopedics Please rest and stay hydrated Please avoid any physical or strenuous activity  Please apply warm compressions and massage with icy hot ointment for muscle relief Please continue to monitor symptoms closely and if symptoms are to worsen or change (fever greater than 101, chills, sweating, nausea, vomiting, chest pain, shortness of breathe, difficulty breathing, weakness, numbness, tingling, worsening or changes to pain pattern, dizziness, fainting, worsening or changes to headache, loss of vision, blurred vision, inability to control urine or bowel movements, fall, injury) please report back to the Emergency Department immediately.   Cervical Strain and Sprain (Whiplash) with Rehab Cervical strain and sprain are injuries that commonly occur with "whiplash" injuries. Whiplash occurs when the neck is forcefully whipped backward or forward, such as during a motor vehicle accident or during contact sports. The muscles, ligaments, tendons, discs, and nerves of the neck are susceptible to injury when this occurs. RISK FACTORS Risk of having a whiplash injury increases if:  Osteoarthritis of the spine.  Situations that make head or neck accidents or trauma more likely.  High-risk sports (football, rugby, wrestling, hockey, auto racing, gymnastics, diving, contact karate, or boxing).  Poor strength and flexibility of the neck.  Previous neck injury.  Poor tackling technique.  Improperly fitted or padded equipment. SYMPTOMS   Pain or stiffness in the front or back  of neck or both.  Symptoms may present immediately or up to 24 hours after injury.  Dizziness, headache, nausea, and vomiting.  Muscle spasm with soreness and stiffness in the neck.  Tenderness and swelling at the injury site. PREVENTION  Learn and use proper technique (avoid tackling with the head, spearing, and head-butting; use proper falling techniques to avoid landing on the head).  Warm up and stretch properly before activity.  Maintain physical fitness:  Strength, flexibility, and endurance.  Cardiovascular fitness.  Wear properly fitted and padded protective equipment, such as padded soft collars, for participation in contact sports. PROGNOSIS  Recovery from cervical strain and sprain injuries is dependent on the extent of the injury. These injuries are usually curable in 1 week to 3 months with appropriate treatment.  RELATED COMPLICATIONS   Temporary numbness and weakness may occur if the nerve roots are damaged, and this may persist until the nerve has completely healed.  Chronic pain due to frequent recurrence of symptoms.  Prolonged healing, especially if activity is resumed too soon (before complete recovery). TREATMENT  Treatment initially involves the use of ice and medication to help reduce pain and inflammation. It is also important to perform strengthening and stretching exercises and modify activities that worsen symptoms so the injury does not get worse. These exercises may be performed at home or with a therapist. For patients who experience severe symptoms, a soft, padded collar may be recommended to be worn around the neck.  Improving your posture may help reduce symptoms. Posture improvement includes pulling your chin and abdomen in while sitting or standing. If you are sitting, sit in a firm chair with your buttocks against the back of the chair. While sleeping,  try replacing your pillow with a small towel rolled to 2 inches in diameter, or use a cervical  pillow or soft cervical collar. Poor sleeping positions delay healing.  For patients with nerve root damage, which causes numbness or weakness, the use of a cervical traction apparatus may be recommended. Surgery is rarely necessary for these injuries. However, cervical strain and sprains that are present at birth (congenital) may require surgery. MEDICATION   If pain medication is necessary, nonsteroidal anti-inflammatory medications, such as aspirin and ibuprofen, or other minor pain relievers, such as acetaminophen, are often recommended.  Do not take pain medication for 7 days before surgery.  Prescription pain relievers may be given if deemed necessary by your caregiver. Use only as directed and only as much as you need. HEAT AND COLD:   Cold treatment (icing) relieves pain and reduces inflammation. Cold treatment should be applied for 10 to 15 minutes every 2 to 3 hours for inflammation and pain and immediately after any activity that aggravates your symptoms. Use ice packs or an ice massage.  Heat treatment may be used prior to performing the stretching and strengthening activities prescribed by your caregiver, physical therapist, or athletic trainer. Use a heat pack or a warm soak. SEEK MEDICAL CARE IF:   Symptoms get worse or do not improve in 2 weeks despite treatment.  New, unexplained symptoms develop (drugs used in treatment may produce side effects). EXERCISES RANGE OF MOTION (ROM) AND STRETCHING EXERCISES - Cervical Strain and Sprain These exercises may help you when beginning to rehabilitate your injury. In order to successfully resolve your symptoms, you must improve your posture. These exercises are designed to help reduce the forward-head and rounded-shoulder posture which contributes to this condition. Your symptoms may resolve with or without further involvement from your physician, physical therapist or athletic trainer. While completing these exercises, remember:    Restoring tissue flexibility helps normal motion to return to the joints. This allows healthier, less painful movement and activity.  An effective stretch should be held for at least 20 seconds, although you may need to begin with shorter hold times for comfort.  A stretch should never be painful. You should only feel a gentle lengthening or release in the stretched tissue. STRETCH- Axial Extensors  Lie on your back on the floor. You may bend your knees for comfort. Place a rolled-up hand towel or dish towel, about 2 inches in diameter, under the part of your head that makes contact with the floor.  Gently tuck your chin, as if trying to make a "double chin," until you feel a gentle stretch at the base of your head.  Hold __________ seconds. Repeat __________ times. Complete this exercise __________ times per day.  STRETCH - Axial Extension   Stand or sit on a firm surface. Assume a good posture: chest up, shoulders drawn back, abdominal muscles slightly tense, knees unlocked (if standing) and feet hip width apart.  Slowly retract your chin so your head slides back and your chin slightly lowers. Continue to look straight ahead.  You should feel a gentle stretch in the back of your head. Be certain not to feel an aggressive stretch since this can cause headaches later.  Hold for __________ seconds. Repeat __________ times. Complete this exercise __________ times per day. STRETCH - Cervical Side Bend   Stand or sit on a firm surface. Assume a good posture: chest up, shoulders drawn back, abdominal muscles slightly tense, knees unlocked (if standing) and feet hip width  apart.  Without letting your nose or shoulders move, slowly tip your right / left ear to your shoulder until your feel a gentle stretch in the muscles on the opposite side of your neck.  Hold __________ seconds. Repeat __________ times. Complete this exercise __________ times per day. STRETCH - Cervical Rotators    Stand or sit on a firm surface. Assume a good posture: chest up, shoulders drawn back, abdominal muscles slightly tense, knees unlocked (if standing) and feet hip width apart.  Keeping your eyes level with the ground, slowly turn your head until you feel a gentle stretch along the back and opposite side of your neck.  Hold __________ seconds. Repeat __________ times. Complete this exercise __________ times per day. RANGE OF MOTION - Neck Circles   Stand or sit on a firm surface. Assume a good posture: chest up, shoulders drawn back, abdominal muscles slightly tense, knees unlocked (if standing) and feet hip width apart.  Gently roll your head down and around from the back of one shoulder to the back of the other. The motion should never be forced or painful.  Repeat the motion 10-20 times, or until you feel the neck muscles relax and loosen. Repeat __________ times. Complete the exercise __________ times per day. STRENGTHENING EXERCISES - Cervical Strain and Sprain These exercises may help you when beginning to rehabilitate your injury. They may resolve your symptoms with or without further involvement from your physician, physical therapist, or athletic trainer. While completing these exercises, remember:   Muscles can gain both the endurance and the strength needed for everyday activities through controlled exercises.  Complete these exercises as instructed by your physician, physical therapist, or athletic trainer. Progress the resistance and repetitions only as guided.  You may experience muscle soreness or fatigue, but the pain or discomfort you are trying to eliminate should never worsen during these exercises. If this pain does worsen, stop and make certain you are following the directions exactly. If the pain is still present after adjustments, discontinue the exercise until you can discuss the trouble with your clinician. STRENGTH - Cervical Flexors, Isometric  Face a wall,  standing about 6 inches away. Place a small pillow, a ball about 6-8 inches in diameter, or a folded towel between your forehead and the wall.  Slightly tuck your chin and gently push your forehead into the soft object. Push only with mild to moderate intensity, building up tension gradually. Keep your jaw and forehead relaxed.  Hold 10 to 20 seconds. Keep your breathing relaxed.  Release the tension slowly. Relax your neck muscles completely before you start the next repetition. Repeat __________ times. Complete this exercise __________ times per day. STRENGTH- Cervical Lateral Flexors, Isometric   Stand about 6 inches away from a wall. Place a small pillow, a ball about 6-8 inches in diameter, or a folded towel between the side of your head and the wall.  Slightly tuck your chin and gently tilt your head into the soft object. Push only with mild to moderate intensity, building up tension gradually. Keep your jaw and forehead relaxed.  Hold 10 to 20 seconds. Keep your breathing relaxed.  Release the tension slowly. Relax your neck muscles completely before you start the next repetition. Repeat __________ times. Complete this exercise __________ times per day. STRENGTH - Cervical Extensors, Isometric   Stand about 6 inches away from a wall. Place a small pillow, a ball about 6-8 inches in diameter, or a folded towel between the back of  your head and the wall.  Slightly tuck your chin and gently tilt your head back into the soft object. Push only with mild to moderate intensity, building up tension gradually. Keep your jaw and forehead relaxed.  Hold 10 to 20 seconds. Keep your breathing relaxed.  Release the tension slowly. Relax your neck muscles completely before you start the next repetition. Repeat __________ times. Complete this exercise __________ times per day. POSTURE AND BODY MECHANICS CONSIDERATIONS - Cervical Strain and Sprain Keeping correct posture when sitting, standing or  completing your activities will reduce the stress put on different body tissues, allowing injured tissues a chance to heal and limiting painful experiences. The following are general guidelines for improved posture. Your physician or physical therapist will provide you with any instructions specific to your needs. While reading these guidelines, remember:  The exercises prescribed by your provider will help you have the flexibility and strength to maintain correct postures.  The correct posture provides the optimal environment for your joints to work. All of your joints have less wear and tear when properly supported by a spine with good posture. This means you will experience a healthier, less painful body.  Correct posture must be practiced with all of your activities, especially prolonged sitting and standing. Correct posture is as important when doing repetitive low-stress activities (typing) as it is when doing a single heavy-load activity (lifting). PROLONGED STANDING WHILE SLIGHTLY LEANING FORWARD When completing a task that requires you to lean forward while standing in one place for a long time, place either foot up on a stationary 2- to 4-inch high object to help maintain the best posture. When both feet are on the ground, the low back tends to lose its slight inward curve. If this curve flattens (or becomes too large), then the back and your other joints will experience too much stress, fatigue more quickly, and can cause pain.  RESTING POSITIONS Consider which positions are most painful for you when choosing a resting position. If you have pain with flexion-based activities (sitting, bending, stooping, squatting), choose a position that allows you to rest in a less flexed posture. You would want to avoid curling into a fetal position on your side. If your pain worsens with extension-based activities (prolonged standing, working overhead), avoid resting in an extended position such as sleeping  on your stomach. Most people will find more comfort when they rest with their spine in a more neutral position, neither too rounded nor too arched. Lying on a non-sagging bed on your side with a pillow between your knees, or on your back with a pillow under your knees will often provide some relief. Keep in mind, being in any one position for a prolonged period of time, no matter how correct your posture, can still lead to stiffness. WALKING Walk with an upright posture. Your ears, shoulders, and hips should all line up. OFFICE WORK When working at a desk, create an environment that supports good, upright posture. Without extra support, muscles fatigue and lead to excessive strain on joints and other tissues. CHAIR:  A chair should be able to slide under your desk when your back makes contact with the back of the chair. This allows you to work closely.  The chair's height should allow your eyes to be level with the upper part of your monitor and your hands to be slightly lower than your elbows.  Body position:  Your feet should make contact with the floor. If this is not possible, use  a foot rest.  Keep your ears over your shoulders. This will reduce stress on your neck and low back. Document Released: 12/23/2004 Document Revised: 05/09/2013 Document Reviewed: 04/06/2008 [facility] Patient Information 2015 [place], [place]. This information is not intended to replace advice given to you by your health care provider. Make sure you discuss any questions you have with your health care provider.

## 2014-05-29 ENCOUNTER — Other Ambulatory Visit: Payer: Self-pay

## 2014-05-29 DIAGNOSIS — Z1231 Encounter for screening mammogram for malignant neoplasm of breast: Secondary | ICD-10-CM

## 2014-05-30 ENCOUNTER — Ambulatory Visit
Admission: RE | Admit: 2014-05-30 | Discharge: 2014-05-30 | Disposition: A | Discharge status: Home / Self Care | Payer: BLUE CROSS/BLUE SHIELD | Source: Ambulatory Visit

## 2014-05-30 DIAGNOSIS — Z1231 Encounter for screening mammogram for malignant neoplasm of breast: Secondary | ICD-10-CM

## 2015-05-02 ENCOUNTER — Other Ambulatory Visit: Payer: Self-pay

## 2015-05-02 DIAGNOSIS — Z1231 Encounter for screening mammogram for malignant neoplasm of breast: Secondary | ICD-10-CM

## 2015-05-31 ENCOUNTER — Ambulatory Visit: Payer: BLUE CROSS/BLUE SHIELD

## 2015-05-31 ENCOUNTER — Ambulatory Visit
Admission: RE | Admit: 2015-05-31 | Discharge: 2015-05-31 | Disposition: A | Discharge status: Home / Self Care | Payer: BLUE CROSS/BLUE SHIELD | Source: Ambulatory Visit

## 2015-05-31 DIAGNOSIS — Z1231 Encounter for screening mammogram for malignant neoplasm of breast: Secondary | ICD-10-CM

## 2015-06-06 ENCOUNTER — Other Ambulatory Visit: Payer: Self-pay | Admitting: Obstetrics and Gynecology

## 2015-06-06 DIAGNOSIS — R928 Other abnormal and inconclusive findings on diagnostic imaging of breast: Secondary | ICD-10-CM

## 2015-06-12 ENCOUNTER — Other Ambulatory Visit: Payer: BLUE CROSS/BLUE SHIELD

## 2015-06-12 ENCOUNTER — Ambulatory Visit
Admission: RE | Admit: 2015-06-12 | Discharge: 2015-06-12 | Disposition: A | Discharge status: Home / Self Care | Payer: BLUE CROSS/BLUE SHIELD | Source: Ambulatory Visit | Attending: Obstetrics and Gynecology | Admitting: Obstetrics and Gynecology

## 2015-06-12 DIAGNOSIS — R928 Other abnormal and inconclusive findings on diagnostic imaging of breast: Secondary | ICD-10-CM

## 2015-07-07 DIAGNOSIS — R0602 Shortness of breath: Secondary | ICD-10-CM

## 2015-07-07 HISTORY — DX: Shortness of breath: R06.02

## 2015-07-26 ENCOUNTER — Ambulatory Visit (HOSPITAL_BASED_OUTPATIENT_CLINIC_OR_DEPARTMENT_OTHER)
Admission: RE | Admit: 2015-07-26 | Discharge: 2015-07-26 | Disposition: A | Discharge status: Home / Self Care | Payer: BLUE CROSS/BLUE SHIELD | Source: Ambulatory Visit | Attending: Family Medicine | Admitting: Family Medicine

## 2015-07-26 ENCOUNTER — Ambulatory Visit (INDEPENDENT_AMBULATORY_CARE_PROVIDER_SITE_OTHER): Payer: BLUE CROSS/BLUE SHIELD | Admitting: Family Medicine

## 2015-07-26 ENCOUNTER — Encounter: Payer: Self-pay | Admitting: Family Medicine

## 2015-07-26 VITALS — BP 102/63 | HR 85 | Temp 98.4°F | Resp 16 | Ht 64.5 in | Wt 131.0 lb

## 2015-07-26 DIAGNOSIS — R0602 Shortness of breath: Secondary | ICD-10-CM

## 2015-07-26 DIAGNOSIS — R002 Palpitations: Secondary | ICD-10-CM | POA: Diagnosis not present

## 2015-07-26 NOTE — Progress Notes (Addendum)
OFFICE VISIT  07/26/2015   CC:  Chief Complaint  Patient presents with  . Shortness of Breath    x 3 days    HPI:    Patient is a 45 y.o. Asian female who presents for about 3d of SOB, today feeling some URI sx's, and about 1 mo of decreased energy.  No CP, no cough, no fever.  No wheezing. Runny nose and left ear pain this morning--then this seems to have gotten better this afternoon. Has some orthostatic dizziness that she says is chronic.  This has been a bit worse lately. No significant emotional stressors seem to be impacting her right now.  ROS: Has hx of heavy menses (menses q25d, bleeding for 5-7d, very heavy for first 2d), takes iron pill but only intermittently.  Slight HA this morning.  No muscle or joint aches.  Has some random episodes of feeling her heart racing.  Past Medical History  Diagnosis Date  . GERD (gastroesophageal reflux disease)   . Family history of colon cancer     Past Surgical History  Procedure Laterality Date  . Cesarean section  1998  . Esophagogastroduodenoscopy  12/2010    NORMAL.  Esoph bx NORMAL  . Colonoscopy  12/2010    NORMAL: repeat 5 yrs due to [place]   MED: none  Allergies  Allergen Reactions  . Penicillins     ROS As per HPI  PE: Blood pressure 102/63, pulse 85, temperature 98.4 F (36.9 C), temperature source Oral, resp. rate 16, height 5' 4.5" (1.638 m), weight 131 lb (59.421 kg), SpO2 99 %. Gen: Alert, well appearing.  Patient is oriented to person, place, time, and situation. AFFECT: pleasant, lucid thought and speech. ENT: Ears: EACs clear, normal epithelium.  TMs with good light reflex and landmarks bilaterally.  Eyes: no injection, icteris, swelling, or exudate.  EOMI, PERRLA. Nose: no drainage or turbinate edema/swelling.  No injection or focal lesion.  Mouth: lips without lesion/swelling.  Oral mucosa pink and moist.  Dentition intact and without obvious caries or gingival swelling.  Oropharynx without erythema,  exudate, or swelling.  Neck - No masses or thyromegaly or limitation in range of motion CV: RRR, no m/r/g.   LUNGS: CTA bilat, nonlabored resps, good aeration in all lung fields. EXT: no clubbing, cyanosis, or edema.   LABS:  12 lead EKG: NSR, rate 75, no ectopy or ischemic changes.  No hypertrophy.  IMPRESSION AND PLAN:  Shortness of breath: patient is definitely anxious, and states that her primary worry is lung cancer. I told her I didn't know exactly what was making her feel SOB.  She looks well in the office today, not visibly SOB.  I discussed the possibility of getting a CT chest angio to r/o PE, but when learning of the potential cost of this she decided that this was a test we should just wait on for now. Will obtain plain film of chest today. She will be getting a lab work up via her Scientist, water quality tomorrow. We'll see how she feels over the next week or so and if SOB persists and we have no explanation then perhaps we'll pursue the PE study at that time.  An After Visit Summary was printed and given to the patient.  FOLLOW UP: Return if symptoms worsen or fail to improve.  Signed:  Crissie Sickles, MD           07/26/2015  Addendum 08/02/15: Reviewed labs sent from [facility], dated 07/27/15:  All normal (CBC, CMET, thyroid studies, lipid panel, iron levels/ferritin, HbA1c, vit b12, and folate) except low vit D at 21.  --PM

## 2015-07-26 NOTE — Progress Notes (Signed)
Pre visit review using our clinic review tool, if applicable. No additional management support is needed unless otherwise documented below in the visit note. 

## 2015-08-09 ENCOUNTER — Encounter: Payer: Self-pay | Admitting: *Deleted

## 2015-08-09 ENCOUNTER — Other Ambulatory Visit: Payer: Self-pay | Admitting: *Deleted

## 2015-08-09 DIAGNOSIS — E559 Vitamin D deficiency, unspecified: Secondary | ICD-10-CM

## 2015-08-09 HISTORY — DX: Vitamin D deficiency, unspecified: E55.9

## 2015-08-09 MED ORDER — VITAMIN D (ERGOCALCIFEROL) 1.25 MG (50000 UNIT) PO CAPS: 50000.0000 [IU] | ORAL_CAPSULE | ORAL | 2 refills | Status: DC

## 2015-10-04 ENCOUNTER — Encounter: Payer: Self-pay | Admitting: Family Medicine

## 2015-10-04 ENCOUNTER — Ambulatory Visit (INDEPENDENT_AMBULATORY_CARE_PROVIDER_SITE_OTHER): Payer: BLUE CROSS/BLUE SHIELD | Admitting: Family Medicine

## 2015-10-04 VITALS — BP 99/66 | HR 75 | Temp 98.2°F | Resp 16 | Wt 131.1 lb

## 2015-10-04 DIAGNOSIS — F411 Generalized anxiety disorder: Secondary | ICD-10-CM

## 2015-10-04 DIAGNOSIS — H538 Other visual disturbances: Secondary | ICD-10-CM

## 2015-10-04 DIAGNOSIS — R42 Dizziness and giddiness: Secondary | ICD-10-CM | POA: Diagnosis not present

## 2015-10-04 DIAGNOSIS — G4452 New daily persistent headache (NDPH): Secondary | ICD-10-CM | POA: Diagnosis not present

## 2015-10-04 LAB — BASIC METABOLIC PANEL
BUN: 12 mg/dL (ref 6–23)
CALCIUM: 9.1 mg/dL (ref 8.4–10.5)
CO2: 30 meq/L (ref 19–32)
CREATININE: 0.55 mg/dL (ref 0.40–1.20)
Chloride: 103 mEq/L (ref 96–112)
GFR: 125.76 mL/min (ref >60.00)
Glucose, Bld: 91 mg/dL (ref 70–99)
Potassium: 4.2 mEq/L (ref 3.5–5.1)
SODIUM: 140 meq/L (ref 135–145)

## 2015-10-04 LAB — CBC WITH DIFFERENTIAL/PLATELET
Basophils Absolute: 0 10*3/uL (ref 0.0–0.1)
Basophils Relative: 0.5 % (ref 0.0–3.0)
EOS ABS: 0 10*3/uL (ref 0.0–0.7)
Eosinophils Relative: 0.8 % (ref 0.0–5.0)
HCT: 42.3 % (ref 36.0–46.0)
Hemoglobin: 14.5 g/dL (ref 12.0–15.0)
LYMPHS ABS: 1.9 10*3/uL (ref 0.7–4.0)
Lymphocytes Relative: 38.4 % (ref 12.0–46.0)
MCHC: 34.3 g/dL (ref 30.0–36.0)
MCV: 88.9 fl (ref 78.0–100.0)
MONO ABS: 0.4 10*3/uL (ref 0.1–1.0)
Monocytes Relative: 7.9 % (ref 3.0–12.0)
NEUTROS PCT: 52.4 % (ref 43.0–77.0)
Neutro Abs: 2.6 10*3/uL (ref 1.4–7.7)
Platelets: 235 10*3/uL (ref 150.0–400.0)
RBC: 4.76 Mil/uL (ref 3.87–5.11)
RDW: 12.7 % (ref 11.5–15.5)
WBC: 5 10*3/uL (ref 4.0–10.5)

## 2015-10-04 NOTE — Patient Instructions (Signed)
Take the muscle relaxer you have at night.  Your Blood pressure is normal and the higher readings are likely from anxiety or pain.  I am going to do some minor lab work today and we can order a scan if needed. We will call you to set up.
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
## 2015-10-04 NOTE — Progress Notes (Signed)
Pre visit review using our clinic review tool, if applicable. No additional management support is needed unless otherwise documented below in the visit note. 

## 2015-10-05 ENCOUNTER — Telehealth: Payer: Self-pay | Admitting: Family Medicine

## 2015-10-05 DIAGNOSIS — G4452 New daily persistent headache (NDPH): Secondary | ICD-10-CM

## 2015-10-05 DIAGNOSIS — R42 Dizziness and giddiness: Secondary | ICD-10-CM

## 2015-10-05 DIAGNOSIS — H538 Other visual disturbances: Secondary | ICD-10-CM

## 2015-10-05 NOTE — Telephone Encounter (Signed)
Spoke with patient reviewed lab results and referral information with patient.

## 2015-10-05 NOTE — Telephone Encounter (Signed)
Please call pt: - labs are normal.  - CT head has been ordered and she should be hearing about scheduling soon, if not already.  - Please encourage her to contact one of the therapists or psych on the list I gave her to discuss her anxiety around driving.  - I have also placed a referral to neurology for further eval on headaches and dizziness.

## 2015-10-09 ENCOUNTER — Ambulatory Visit (HOSPITAL_BASED_OUTPATIENT_CLINIC_OR_DEPARTMENT_OTHER)
Admission: RE | Admit: 2015-10-09 | Discharge: 2015-10-09 | Disposition: A | Discharge status: Home / Self Care | Payer: BLUE CROSS/BLUE SHIELD | Source: Ambulatory Visit | Attending: Family Medicine | Admitting: Family Medicine

## 2015-10-09 DIAGNOSIS — R42 Dizziness and giddiness: Secondary | ICD-10-CM

## 2015-10-09 DIAGNOSIS — H538 Other visual disturbances: Secondary | ICD-10-CM | POA: Diagnosis not present

## 2015-10-09 DIAGNOSIS — G4452 New daily persistent headache (NDPH): Secondary | ICD-10-CM | POA: Diagnosis not present

## 2015-10-10 ENCOUNTER — Telehealth: Payer: Self-pay | Admitting: Family Medicine

## 2015-10-10 NOTE — Telephone Encounter (Signed)
Please call pt: - her CT of her head/brain is normal.  - I have referred her to neurology, please make sure this is being completed, I do not see an appt for her set up.  - I also encouraged her to see a therapist/psych surrounding her anxiety driving, and provided her with a list of local providers. Please ask/encourage her to follow through with this as well.

## 2015-10-10 NOTE — Telephone Encounter (Signed)
Spoke with patient reviewed CT results and instructions. Patient verbalized understanding.

## 2015-11-13 ENCOUNTER — Encounter: Payer: Self-pay | Admitting: Gastroenterology

## 2015-12-21 ENCOUNTER — Ambulatory Visit (INDEPENDENT_AMBULATORY_CARE_PROVIDER_SITE_OTHER): Payer: BLUE CROSS/BLUE SHIELD | Admitting: Neurology

## 2015-12-21 ENCOUNTER — Encounter: Payer: Self-pay | Admitting: Neurology

## 2015-12-21 VITALS — BP 112/66 | HR 86 | Ht 64.5 in | Wt 132.0 lb

## 2015-12-21 DIAGNOSIS — S161XXD Strain of muscle, fascia and tendon at neck level, subsequent encounter: Secondary | ICD-10-CM

## 2015-12-21 DIAGNOSIS — R51 Headache: Secondary | ICD-10-CM

## 2015-12-21 DIAGNOSIS — G4486 Cervicogenic headache: Secondary | ICD-10-CM

## 2015-12-21 MED ORDER — NORTRIPTYLINE HCL 10 MG PO CAPS: 10.0000 mg | ORAL_CAPSULE | Freq: Every day | ORAL | 3 refills | Status: DC
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
## 2015-12-21 NOTE — Patient Instructions (Addendum)
1.  We will start nortriptyline 10mg  at bedtime.  It is an antidepressant used to prevent and treat headaches.  Contact me in 4 weeks with update and we can increase dose if needed. 2.  You may use ibuprofen as needed for headache or neck pain, but try to limit use to no more than 2 days out of the week. 3.  Try these over the counter vitamins/supplements:  Magnesium citrate 400mg  daily, riboflavin 400mg  daily, turmeric 500mg  twice daily and coenzyme Q10 100mg  three times daily. 4.  Use heat to the neck.  At night, use a flat memory-foam pillow.  Keep your neck in a neutral position (do not look up or down when reading, keep it eye level). 5.  I will send you to Dr. Hulan Saas, a Sports Medicine doctor who treats neck pain. 6.  Follow up in 3 months.  Contact me in 4 weeks with update.

## 2015-12-23 ENCOUNTER — Encounter: Payer: Self-pay | Admitting: Family Medicine

## 2016-04-22 ENCOUNTER — Ambulatory Visit: Payer: BLUE CROSS/BLUE SHIELD | Admitting: Neurology

## 2016-04-22 DIAGNOSIS — Z029 Encounter for administrative examinations, unspecified: Secondary | ICD-10-CM

## 2016-04-23 ENCOUNTER — Encounter: Payer: Self-pay | Admitting: Neurology

## 2016-07-03 ENCOUNTER — Other Ambulatory Visit: Payer: Self-pay | Admitting: Obstetrics and Gynecology

## 2016-07-03 DIAGNOSIS — Z1231 Encounter for screening mammogram for malignant neoplasm of breast: Secondary | ICD-10-CM

## 2016-07-15 ENCOUNTER — Ambulatory Visit
Admission: RE | Admit: 2016-07-15 | Discharge: 2016-07-15 | Disposition: A | Discharge status: Home / Self Care | Payer: BLUE CROSS/BLUE SHIELD | Source: Ambulatory Visit | Attending: Obstetrics and Gynecology | Admitting: Obstetrics and Gynecology

## 2016-07-15 DIAGNOSIS — Z1231 Encounter for screening mammogram for malignant neoplasm of breast: Secondary | ICD-10-CM

## 2017-07-01 IMAGING — DX DG CHEST 2V
2 series · 2 of 2 positions shown · non-contrast
Comparison: Radiographs May 29, 2009.

CLINICAL DATA: Acute shortness of breath.

EXAM:
CHEST  2 VIEW

[chest pa]
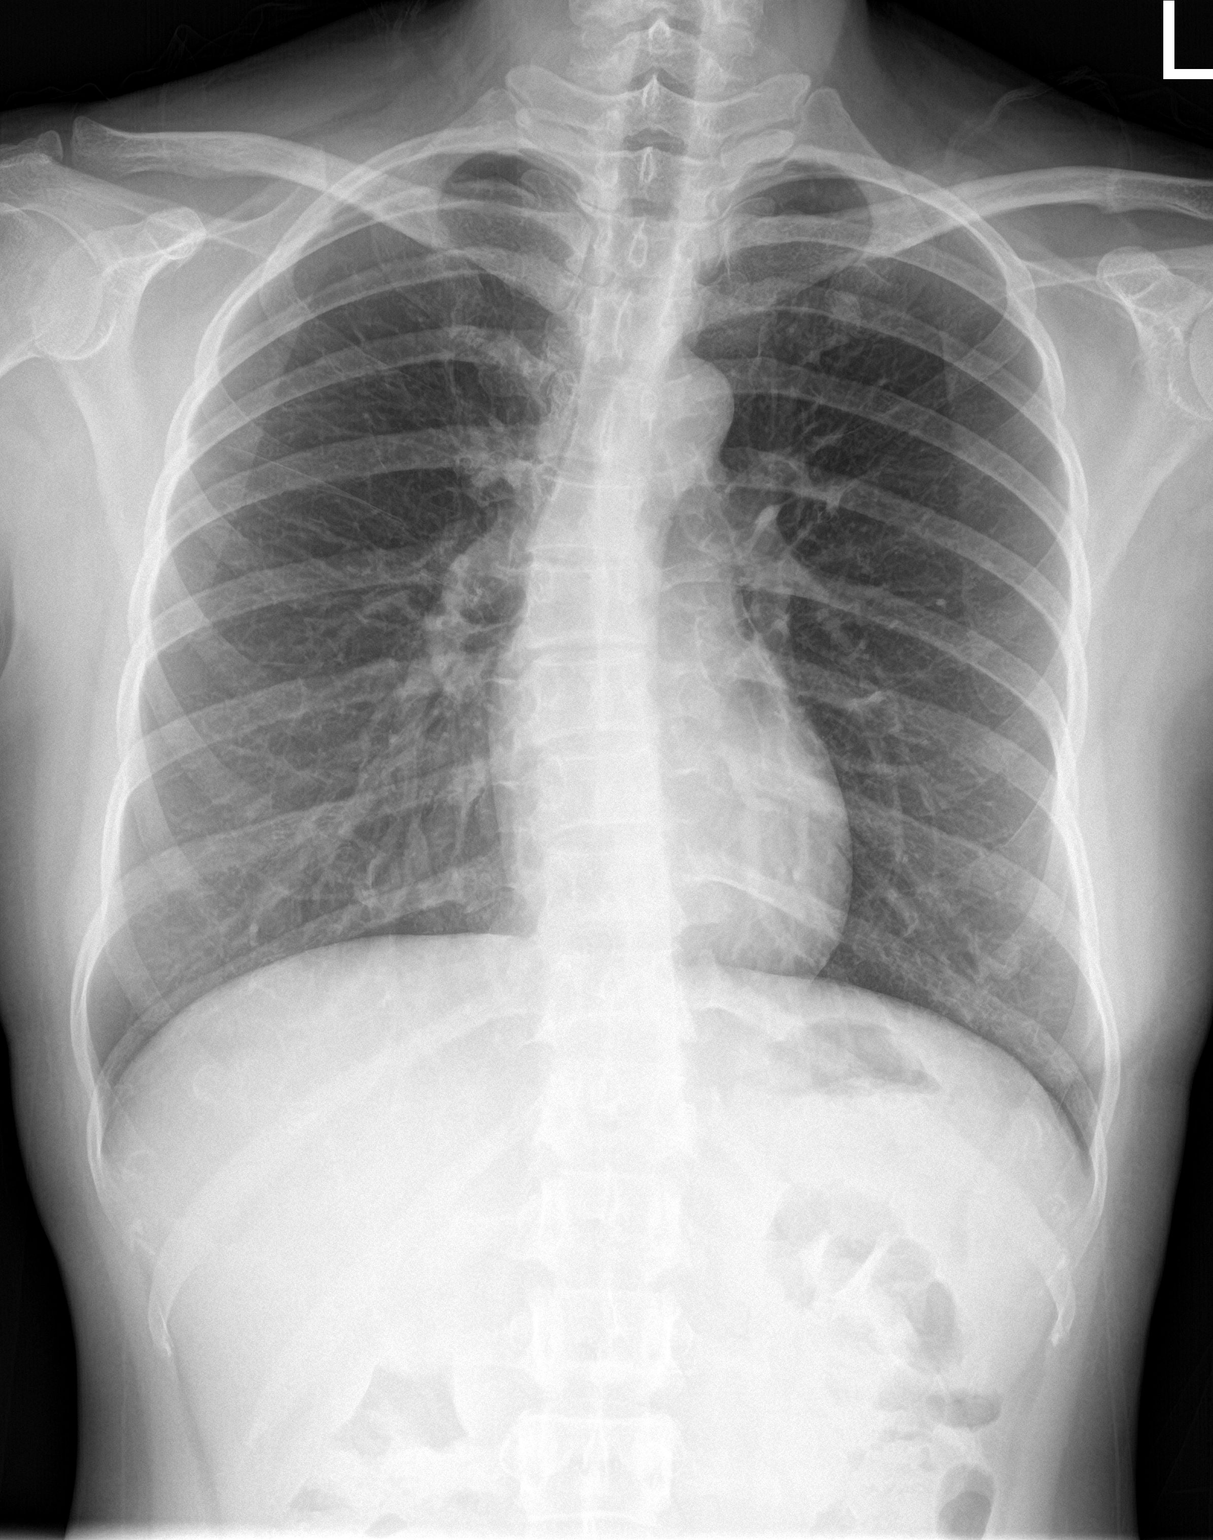

[chest lat]
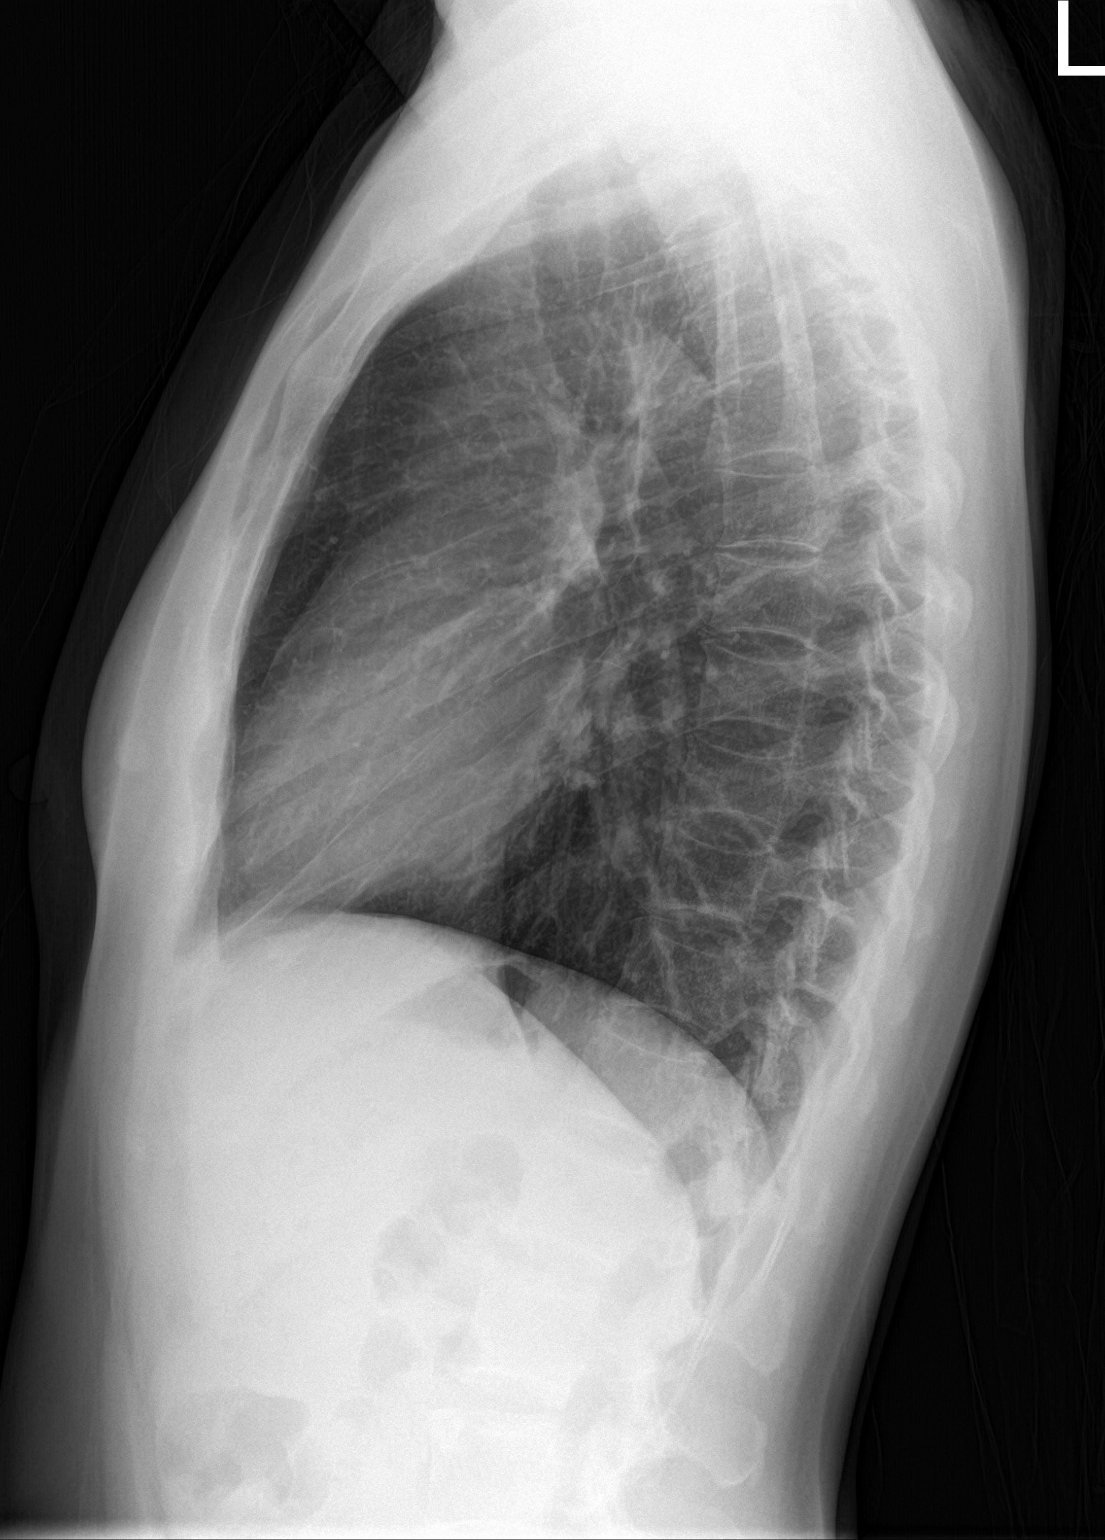

[2 of 2 positions shown; findings below may reference images not displayed]

FINDINGS: The heart size and mediastinal contours are within normal limits.
Both lungs are clear. No pneumothorax or pleural effusion is noted.
The visualized skeletal structures are unremarkable.
IMPRESSION: No active cardiopulmonary disease.

## 2017-08-28 ENCOUNTER — Other Ambulatory Visit: Payer: Self-pay | Admitting: Obstetrics and Gynecology

## 2017-08-28 DIAGNOSIS — Z1231 Encounter for screening mammogram for malignant neoplasm of breast: Secondary | ICD-10-CM
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
## 2017-09-23 ENCOUNTER — Ambulatory Visit
Admission: RE | Admit: 2017-09-23 | Discharge: 2017-09-23 | Disposition: A | Discharge status: Home / Self Care | Payer: BLUE CROSS/BLUE SHIELD | Source: Ambulatory Visit | Attending: Obstetrics and Gynecology | Admitting: Obstetrics and Gynecology

## 2017-09-23 DIAGNOSIS — Z1231 Encounter for screening mammogram for malignant neoplasm of breast: Secondary | ICD-10-CM

## 2018-03-04 DIAGNOSIS — M9901 Segmental and somatic dysfunction of cervical region: Secondary | ICD-10-CM | POA: Diagnosis not present

## 2018-03-04 DIAGNOSIS — M531 Cervicobrachial syndrome: Secondary | ICD-10-CM | POA: Diagnosis not present

## 2018-03-04 DIAGNOSIS — M9903 Segmental and somatic dysfunction of lumbar region: Secondary | ICD-10-CM | POA: Diagnosis not present

## 2018-03-04 DIAGNOSIS — M9902 Segmental and somatic dysfunction of thoracic region: Secondary | ICD-10-CM | POA: Diagnosis not present

## 2018-09-25 DIAGNOSIS — M9903 Segmental and somatic dysfunction of lumbar region: Secondary | ICD-10-CM | POA: Diagnosis not present

## 2018-09-25 DIAGNOSIS — M9902 Segmental and somatic dysfunction of thoracic region: Secondary | ICD-10-CM | POA: Diagnosis not present

## 2018-09-25 DIAGNOSIS — M531 Cervicobrachial syndrome: Secondary | ICD-10-CM | POA: Diagnosis not present

## 2018-09-25 DIAGNOSIS — M9901 Segmental and somatic dysfunction of cervical region: Secondary | ICD-10-CM | POA: Diagnosis not present

## 2018-09-28 DIAGNOSIS — L821 Other seborrheic keratosis: Secondary | ICD-10-CM | POA: Diagnosis not present

## 2018-10-07 DIAGNOSIS — M9901 Segmental and somatic dysfunction of cervical region: Secondary | ICD-10-CM | POA: Diagnosis not present

## 2018-10-07 DIAGNOSIS — M9903 Segmental and somatic dysfunction of lumbar region: Secondary | ICD-10-CM | POA: Diagnosis not present

## 2018-10-07 DIAGNOSIS — M531 Cervicobrachial syndrome: Secondary | ICD-10-CM | POA: Diagnosis not present

## 2018-10-07 DIAGNOSIS — M9902 Segmental and somatic dysfunction of thoracic region: Secondary | ICD-10-CM | POA: Diagnosis not present

## 2018-10-28 DIAGNOSIS — M9901 Segmental and somatic dysfunction of cervical region: Secondary | ICD-10-CM | POA: Diagnosis not present

## 2018-10-28 DIAGNOSIS — M9902 Segmental and somatic dysfunction of thoracic region: Secondary | ICD-10-CM | POA: Diagnosis not present

## 2018-10-28 DIAGNOSIS — M9903 Segmental and somatic dysfunction of lumbar region: Secondary | ICD-10-CM | POA: Diagnosis not present

## 2018-10-28 DIAGNOSIS — M531 Cervicobrachial syndrome: Secondary | ICD-10-CM | POA: Diagnosis not present

## 2018-11-11 DIAGNOSIS — M9902 Segmental and somatic dysfunction of thoracic region: Secondary | ICD-10-CM | POA: Diagnosis not present

## 2018-11-11 DIAGNOSIS — M9903 Segmental and somatic dysfunction of lumbar region: Secondary | ICD-10-CM | POA: Diagnosis not present

## 2018-11-11 DIAGNOSIS — M9901 Segmental and somatic dysfunction of cervical region: Secondary | ICD-10-CM | POA: Diagnosis not present

## 2018-11-11 DIAGNOSIS — M531 Cervicobrachial syndrome: Secondary | ICD-10-CM | POA: Diagnosis not present

## 2018-11-25 DIAGNOSIS — M531 Cervicobrachial syndrome: Secondary | ICD-10-CM | POA: Diagnosis not present

## 2018-11-25 DIAGNOSIS — M9903 Segmental and somatic dysfunction of lumbar region: Secondary | ICD-10-CM | POA: Diagnosis not present

## 2018-11-25 DIAGNOSIS — M9901 Segmental and somatic dysfunction of cervical region: Secondary | ICD-10-CM | POA: Diagnosis not present

## 2018-11-25 DIAGNOSIS — M9902 Segmental and somatic dysfunction of thoracic region: Secondary | ICD-10-CM | POA: Diagnosis not present

## 2018-11-26 DIAGNOSIS — M24573 Contracture, unspecified ankle: Secondary | ICD-10-CM | POA: Diagnosis not present

## 2018-11-26 DIAGNOSIS — M7741 Metatarsalgia, right foot: Secondary | ICD-10-CM | POA: Diagnosis not present

## 2018-11-26 DIAGNOSIS — M79671 Pain in right foot: Secondary | ICD-10-CM | POA: Diagnosis not present

## 2018-11-26 DIAGNOSIS — M778 Other enthesopathies, not elsewhere classified: Secondary | ICD-10-CM | POA: Diagnosis not present

## 2018-11-29 ENCOUNTER — Other Ambulatory Visit: Payer: Self-pay | Admitting: Obstetrics and Gynecology

## 2018-11-29 DIAGNOSIS — Z1231 Encounter for screening mammogram for malignant neoplasm of breast: Secondary | ICD-10-CM

## 2018-11-30 ENCOUNTER — Other Ambulatory Visit: Payer: Self-pay

## 2018-11-30 ENCOUNTER — Ambulatory Visit
Admission: RE | Admit: 2018-11-30 | Discharge: 2018-11-30 | Disposition: A | Discharge status: Home / Self Care | Payer: BLUE CROSS/BLUE SHIELD | Source: Ambulatory Visit | Attending: Obstetrics and Gynecology | Admitting: Obstetrics and Gynecology

## 2018-11-30 DIAGNOSIS — M9901 Segmental and somatic dysfunction of cervical region: Secondary | ICD-10-CM | POA: Diagnosis not present

## 2018-11-30 DIAGNOSIS — M9903 Segmental and somatic dysfunction of lumbar region: Secondary | ICD-10-CM | POA: Diagnosis not present

## 2018-11-30 DIAGNOSIS — M531 Cervicobrachial syndrome: Secondary | ICD-10-CM | POA: Diagnosis not present

## 2018-11-30 DIAGNOSIS — M9902 Segmental and somatic dysfunction of thoracic region: Secondary | ICD-10-CM | POA: Diagnosis not present

## 2018-11-30 DIAGNOSIS — Z1231 Encounter for screening mammogram for malignant neoplasm of breast: Secondary | ICD-10-CM | POA: Diagnosis not present

## 2018-12-08 DIAGNOSIS — M9902 Segmental and somatic dysfunction of thoracic region: Secondary | ICD-10-CM | POA: Diagnosis not present

## 2018-12-08 DIAGNOSIS — M531 Cervicobrachial syndrome: Secondary | ICD-10-CM | POA: Diagnosis not present

## 2018-12-08 DIAGNOSIS — M9903 Segmental and somatic dysfunction of lumbar region: Secondary | ICD-10-CM | POA: Diagnosis not present

## 2018-12-08 DIAGNOSIS — M9901 Segmental and somatic dysfunction of cervical region: Secondary | ICD-10-CM | POA: Diagnosis not present

## 2019-01-31 DIAGNOSIS — N6320 Unspecified lump in the left breast, unspecified quadrant: Secondary | ICD-10-CM | POA: Diagnosis not present

## 2019-01-31 DIAGNOSIS — Z01419 Encounter for gynecological examination (general) (routine) without abnormal findings: Secondary | ICD-10-CM | POA: Diagnosis not present

## 2019-01-31 DIAGNOSIS — Z6822 Body mass index (BMI) 22.0-22.9, adult: Secondary | ICD-10-CM | POA: Diagnosis not present

## 2019-02-01 ENCOUNTER — Other Ambulatory Visit: Payer: Self-pay | Admitting: Obstetrics and Gynecology

## 2019-02-01 DIAGNOSIS — N6325 Unspecified lump in the left breast, overlapping quadrants: Secondary | ICD-10-CM

## 2019-02-21 ENCOUNTER — Other Ambulatory Visit: Payer: BC Managed Care – PPO

## 2019-03-15 ENCOUNTER — Ambulatory Visit
Admission: RE | Admit: 2019-03-15 | Discharge: 2019-03-15 | Disposition: A | Discharge status: Home / Self Care | Payer: BC Managed Care – PPO | Source: Ambulatory Visit | Attending: Obstetrics and Gynecology | Admitting: Obstetrics and Gynecology

## 2019-03-15 ENCOUNTER — Other Ambulatory Visit: Payer: Self-pay

## 2019-03-15 DIAGNOSIS — N644 Mastodynia: Secondary | ICD-10-CM | POA: Diagnosis not present

## 2019-03-15 DIAGNOSIS — R922 Inconclusive mammogram: Secondary | ICD-10-CM | POA: Diagnosis not present

## 2019-03-15 DIAGNOSIS — N6325 Unspecified lump in the left breast, overlapping quadrants: Secondary | ICD-10-CM

## 2019-03-15 DIAGNOSIS — N6489 Other specified disorders of breast: Secondary | ICD-10-CM | POA: Diagnosis not present

## 2019-07-14 DIAGNOSIS — R209 Unspecified disturbances of skin sensation: Secondary | ICD-10-CM | POA: Diagnosis not present

## 2019-11-23 ENCOUNTER — Other Ambulatory Visit: Payer: Self-pay | Admitting: Obstetrics and Gynecology

## 2019-11-23 DIAGNOSIS — Z1231 Encounter for screening mammogram for malignant neoplasm of breast: Secondary | ICD-10-CM

## 2019-11-29 DIAGNOSIS — Z713 Dietary counseling and surveillance: Secondary | ICD-10-CM | POA: Diagnosis not present

## 2019-12-12 DIAGNOSIS — M25561 Pain in right knee: Secondary | ICD-10-CM | POA: Diagnosis not present

## 2019-12-12 DIAGNOSIS — M25562 Pain in left knee: Secondary | ICD-10-CM | POA: Diagnosis not present

## 2019-12-14 DIAGNOSIS — R269 Unspecified abnormalities of gait and mobility: Secondary | ICD-10-CM | POA: Diagnosis not present

## 2019-12-14 DIAGNOSIS — R531 Weakness: Secondary | ICD-10-CM | POA: Diagnosis not present

## 2019-12-14 DIAGNOSIS — M25562 Pain in left knee: Secondary | ICD-10-CM | POA: Diagnosis not present

## 2019-12-15 DIAGNOSIS — Z1231 Encounter for screening mammogram for malignant neoplasm of breast: Secondary | ICD-10-CM | POA: Diagnosis not present

## 2019-12-16 DIAGNOSIS — M25562 Pain in left knee: Secondary | ICD-10-CM | POA: Diagnosis not present

## 2019-12-16 DIAGNOSIS — R269 Unspecified abnormalities of gait and mobility: Secondary | ICD-10-CM | POA: Diagnosis not present

## 2019-12-16 DIAGNOSIS — R531 Weakness: Secondary | ICD-10-CM | POA: Diagnosis not present

## 2019-12-20 DIAGNOSIS — R269 Unspecified abnormalities of gait and mobility: Secondary | ICD-10-CM | POA: Diagnosis not present

## 2019-12-20 DIAGNOSIS — M25562 Pain in left knee: Secondary | ICD-10-CM | POA: Diagnosis not present

## 2019-12-20 DIAGNOSIS — R531 Weakness: Secondary | ICD-10-CM | POA: Diagnosis not present

## 2019-12-23 DIAGNOSIS — M25562 Pain in left knee: Secondary | ICD-10-CM | POA: Diagnosis not present

## 2019-12-23 DIAGNOSIS — R531 Weakness: Secondary | ICD-10-CM | POA: Diagnosis not present

## 2019-12-23 DIAGNOSIS — R269 Unspecified abnormalities of gait and mobility: Secondary | ICD-10-CM | POA: Diagnosis not present

## 2020-01-03 ENCOUNTER — Ambulatory Visit (INDEPENDENT_AMBULATORY_CARE_PROVIDER_SITE_OTHER): Payer: BC Managed Care – PPO | Admitting: Nurse Practitioner

## 2020-01-03 ENCOUNTER — Encounter: Payer: Self-pay | Admitting: Nurse Practitioner

## 2020-01-03 VITALS — BP 80/60 | HR 76 | Ht 64.5 in | Wt 135.5 lb

## 2020-01-03 DIAGNOSIS — Z1211 Encounter for screening for malignant neoplasm of colon: Secondary | ICD-10-CM

## 2020-01-03 DIAGNOSIS — K219 Gastro-esophageal reflux disease without esophagitis: Secondary | ICD-10-CM

## 2020-01-03 DIAGNOSIS — K5900 Constipation, unspecified: Secondary | ICD-10-CM | POA: Diagnosis not present

## 2020-01-03 DIAGNOSIS — R269 Unspecified abnormalities of gait and mobility: Secondary | ICD-10-CM | POA: Diagnosis not present

## 2020-01-03 DIAGNOSIS — R531 Weakness: Secondary | ICD-10-CM | POA: Diagnosis not present

## 2020-01-03 DIAGNOSIS — M25562 Pain in left knee: Secondary | ICD-10-CM | POA: Diagnosis not present

## 2020-01-03 MED ORDER — SUPREP BOWEL PREP KIT 17.5-3.13-1.6 GM/177ML PO SOLN
1.0000 | ORAL | 0 refills | Status: DC
Start: 2020-01-03 — End: 2020-02-17

## 2020-01-03 NOTE — Patient Instructions (Addendum)
If you are age 51 or older, your body mass index should be between 23-30. Your Body mass index is 22.9 kg/m. If this is out of the aforementioned range listed, please consider follow up with your Primary Care Provider.  If you are age 72 or younger, your body mass index should be between 19-25. Your Body mass index is 22.9 kg/m. If this is out of the aformentioned range listed, please consider follow up with your Primary Care Provider.     You have been scheduled for an endoscopy and colonoscopy. Please follow the written instructions given to you at your visit today. Please pick up your prep supplies at the pharmacy within the next 1-3 days. If you use inhalers (even only as needed), please bring them with you on the day of your procedure.  We have given you a handout on GERD.  Please purchase the following medications over the counter and take as directed:      Omeprazole 20 MG tablet, take 1 tablet daily for heartburn. Miralax I capful daily for constipation.   Please provide [place] with a copy of your recent lab work.  It was great seeing you today!  Thank you for entrusting me with your care and choosing [facility].  Alcide Evener, NP

## 2020-01-03 NOTE — Progress Notes (Addendum)
01/03/2020 [name] [id number] 1970-02-23   CHIEF COMPLAINT: Schedule a colonoscopy  HISTORY OF PRESENT ILLNESS: [name] is a 49 year old female with a past medical story of vitamin D deficiency and GERD.  S/P C-section in 1998.  She presents to our office today to schedule screening colonoscopy.  She reports having acid reflux daily for the past 2 to 3 years.  She has daytime as well as nighttime reflux symptoms.  She previously took a PPI for several years and her reflux symptoms improved so she stopped taking it 2 or 3 years ago due to having concerns with chronic PPI use.  She takes TUMs as needed. No dysphagia.  No epigastric or upper abdominal pain.  No NSAID use.  He underwent an EGD 02/25/2010 by Dr. Sharlett Iles which was normal.  She is passing a normal formed brown bowel movement daily.  She has intermittent constipation with increased gas per the rectum which typically occurs on the weekends.  No rectal bleeding or melena.  She underwent a colonoscopy 02/25/2010 which was normal and she was advised by Dr. Sharlett Iles to repeat a colonoscopy in 5 years which was not done.  Father with history of esophageal cancer.  Paternal aunt with history of colon cancer and another paternal aunt had pancreatic cancer.  Her weight is stable.  No other complaints today.  She reported having recent laboratory studies done by her employer and she will provide a copy of these results for my review.   EGD 02/25/2010 by Dr. Sharlett Iles: Normal EGD.  Colonoscopy 02/25/2010: Normal colonoscopy 5-year recall due to family history of colon cancer  CTAP 08/01/2016 1. No acute abnormalities or suggested etiology for clinical symptoms.  2. Small left lobe hepatic hemangioma   Past Medical History:  Diagnosis Date  . Cervicogenic headache   . Family history of colon cancer   . GERD (gastroesophageal reflux disease)   . Shortness of breath 07/2015   likely anxiety-related  . Vitamin D deficiency 08/09/2015    Past Surgical History:  Procedure Laterality Date  . CESAREAN SECTION  1998  . COLONOSCOPY  12/2010   NORMAL: repeat 5 yrs due to [place]  . ESOPHAGOGASTRODUODENOSCOPY  12/2010   NORMAL.  Esoph bx NORMAL   Social History: She is married.  She has 2 sons.  She is an Administrator.  Non-smoker.  No alcohol use.  No drug use.  Family History: Mother with history of heart disease, hyperlipidemia, hypertension, diabetes and arthritis.  Father with history of esophageal cancer.  Paternal aunt with history of colon cancer.  Paternal aunt with pancreatic cancer.   Allergies  Allergen Reactions  . Penicillins       Outpatient Encounter Medications as of 01/03/2020  Medication Sig  . nortriptyline (PAMELOR) 10 MG capsule Take 1 capsule (10 mg total) by mouth at bedtime.  . Vitamin D, Ergocalciferol, (DRISDOL) 50000 units CAPS capsule Take 1 capsule (50,000 Units total) by mouth every 7 (seven) days.   No facility-administered encounter medications on file as of 01/03/2020.     REVIEW OF SYSTEMS: Gen: Denies fever, sweats or chills. No weight loss.  CV: Denies chest pain, palpitations or edema. Resp: Denies cough, shortness of breath of hemoptysis.  GI: See HPI. GU : Denies urinary burning, blood in urine, increased urinary frequency or incontinence. MS: Denies joint pain, muscles aches or weakness. Derm: Denies rash, itchiness, skin lesions or unhealing ulcers. Psych: Denies depression, anxiety memory loss. Heme: Denies bruising, bleeding. Neuro:  Denies headaches, dizziness or paresthesias. Endo:  Denies any problems with DM, thyroid or adrenal function.   PHYSICAL EXAM: BP (!) 80/60 (BP Location: Left Arm, Patient Position: Sitting, Cuff Size: Normal)   Pulse 76   Ht 5' 4.5" (1.638 m) Comment: height measured without shoes  Wt 135 lb 8 oz (61.5 kg)   LMP 01/02/2020   BMI 22.90 kg/m  General: Well developed 49 year old female in no acute distress. Head: Normocephalic and  atraumatic. Eyes:  Sclerae non-icteric, conjunctive pink. Ears: Normal auditory acuity. Mouth: Dentition intact. No ulcers or lesions.  Neck: Supple, no lymphadenopathy or thyromegaly.  Lungs: Clear bilaterally to auscultation without wheezes, crackles or rhonchi. Heart: Regular rate and rhythm. No murmur, rub or gallop appreciated.  Abdomen: Soft,non distended. Mild RLQ tenderness without rebound or guarding. No masses. No hepatosplenomegaly. Normoactive bowel sounds x 4 quadrants. Lower mid line abdominal scar intact.  Rectal: Deferred.  Musculoskeletal: Symmetrical with no gross deformities. Skin: Warm and dry. No rash or lesions on visible extremities. Extremities: No edema. Neurological: Alert oriented x 4, no focal deficits.  Psychological:  Alert and cooperative. Normal mood and affect.  ASSESSMENT AND PLAN:  52.  49 year old female presents to schedule a screening colonoscopy.  Family history (paternal aunt) of colon cancer. -Colonoscopy benefits and risks discussed including risk with sedation, risk of bleeding, perforation and infection  -Further follow-up to be determined after colonoscopy completed -Patient will provide me with a copy of her most recent laboratory studies done by her employer for my review, she did not wish to have laboratory studies done today  2. GERD.  Family history (father) of esophageal cancer. -EGD at time of colonoscopy. EGD benefits and risks discussed including risk with sedation, risk of bleeding, perforation and infection  -Omeprazole 20 mg daily -GERD handout  3. Constipation. Increased flatulence.  -Miralax Q HS PRN to increase stool output  Further follow-up to be determined after the above evaluation completed    LABS RECEIVED FROM PATIENT: Labs 10/27/2019: Glu 97. Bun 10. Cr. 0.60. Na 138. K 4.1. Ca 9.2. Phos 4.8. Albumin 4.5. T. Bili 0.5. Alk phos 45. LDH 146. AST 18. ALT 18. GGT 7. Chol 201. TGs 131. LDL 138. TSH 3.190. WBC 3.9. Hg  14.3. HCT 43.0. PLT 204.          CC:  McGowen, Adrian Blackwater, MD

## 2020-01-05 NOTE — Progress Notes (Signed)
Attending Physician's Attestation   I have reviewed the chart.   I agree with the Advanced Practitioner's note, impression, and recommendations with any updates as below.    Mose Colaizzi Mansouraty, MD [facility] Office # [phone]  

## 2020-02-02 DIAGNOSIS — Z01419 Encounter for gynecological examination (general) (routine) without abnormal findings: Secondary | ICD-10-CM | POA: Diagnosis not present

## 2020-02-02 DIAGNOSIS — Z6822 Body mass index (BMI) 22.0-22.9, adult: Secondary | ICD-10-CM | POA: Diagnosis not present

## 2020-02-17 ENCOUNTER — Telehealth: Payer: Self-pay | Admitting: Nurse Practitioner

## 2020-02-17 MED ORDER — SUPREP BOWEL PREP KIT 17.5-3.13-1.6 GM/177ML PO SOLN: 1.0000 | ORAL | 0 refills | Status: DC

## 2020-02-17 NOTE — Telephone Encounter (Signed)
Patient was seen 01/02/21, she was scheduled for a colonoscopy and prep was sent to pharmacy. Patient was notified in writing and verbally to pick up her prep 1-3 days after her 01/02/21 appointment.  Patient colonoscopy 02/22/20, she never picked up the prep and called today from the pharmacy to get it refilled.

## 2020-02-20 ENCOUNTER — Telehealth: Payer: Self-pay

## 2020-02-20 NOTE — Telephone Encounter (Signed)
Called patient back and she confirmed that she has had 3 covid vaccinations.

## 2020-02-20 NOTE — Telephone Encounter (Signed)
Pt is scheduled to see Dr. Rush Landmark on 2/16 at 1:30. Called pt to ask about covid status. There was no documentation in note as to whether pt has had covid vaccine or if there was a test scheduled. No covid information in prep instructions either. Was unable to speak with pt, and unable to leave VM. Will try to reach pt again.

## 2020-02-22 ENCOUNTER — Encounter: Payer: Self-pay | Admitting: Gastroenterology

## 2020-02-22 ENCOUNTER — Ambulatory Visit (AMBULATORY_SURGERY_CENTER): Payer: BC Managed Care – PPO | Admitting: Gastroenterology

## 2020-02-22 ENCOUNTER — Other Ambulatory Visit: Payer: Self-pay

## 2020-02-22 VITALS — BP 92/62 | HR 65 | Temp 97.6°F | Resp 14 | Ht 64.0 in | Wt 135.0 lb

## 2020-02-22 DIAGNOSIS — K317 Polyp of stomach and duodenum: Secondary | ICD-10-CM | POA: Diagnosis not present

## 2020-02-22 DIAGNOSIS — D122 Benign neoplasm of ascending colon: Secondary | ICD-10-CM

## 2020-02-22 DIAGNOSIS — K2951 Unspecified chronic gastritis with bleeding: Secondary | ICD-10-CM | POA: Diagnosis not present

## 2020-02-22 DIAGNOSIS — D120 Benign neoplasm of cecum: Secondary | ICD-10-CM

## 2020-02-22 DIAGNOSIS — Z1211 Encounter for screening for malignant neoplasm of colon: Secondary | ICD-10-CM | POA: Diagnosis not present

## 2020-02-22 DIAGNOSIS — K2970 Gastritis, unspecified, without bleeding: Secondary | ICD-10-CM | POA: Diagnosis not present

## 2020-02-22 DIAGNOSIS — K219 Gastro-esophageal reflux disease without esophagitis: Secondary | ICD-10-CM | POA: Diagnosis not present

## 2020-02-22 DIAGNOSIS — K3189 Other diseases of stomach and duodenum: Secondary | ICD-10-CM

## 2020-02-22 DIAGNOSIS — K2100 Gastro-esophageal reflux disease with esophagitis, without bleeding: Secondary | ICD-10-CM

## 2020-02-22 MED ORDER — SODIUM CHLORIDE 0.9 % IV SOLN: 500.0000 mL | Freq: Once | INTRAVENOUS | Status: DC

## 2020-02-22 MED ORDER — OMEPRAZOLE 40 MG PO CPDR: 40.0000 mg | DELAYED_RELEASE_CAPSULE | Freq: Two times a day (BID) | ORAL | 4 refills | Status: DC

## 2020-02-22 NOTE — Op Note (Addendum)
[facility] Patient Name: [name] Procedure Date: 02/22/2020 1:25 PM MRN: [id number] Endoscopist: Justice Britain , MD Age: 50 Referring MD:  Date of Birth: 1970/02/20 Gender: Female Account #: [id number] Procedure:                Upper GI endoscopy Indications:              Suspected esophageal reflux, Esophageal reflux                            symptoms that recur despite appropriate therapy,                            Family history of esophageal cancer (Father) Medicines:                Monitored Anesthesia Care Procedure:                Pre-Anesthesia Assessment:                           - Prior to the procedure, a History and Physical                            was performed, and patient medications and                            allergies were reviewed. The patient's tolerance of                            previous anesthesia was also reviewed. The risks                            and benefits of the procedure and the sedation                            options and risks were discussed with the patient.                            All questions were answered, and informed consent                            was obtained. Prior Anticoagulants: The patient has                            taken no previous anticoagulant or antiplatelet                            agents. ASA Grade Assessment: II - A patient with                            mild systemic disease. After reviewing the risks                            and benefits, the patient was deemed in  satisfactory condition to undergo the procedure.                           After obtaining informed consent, the endoscope was                            passed under direct vision. Throughout the                            procedure, the patient's blood pressure, pulse, and                            oxygen saturations were monitored continuously. The                            Endoscope was  introduced through the mouth, and                            advanced to the second part of duodenum. The upper                            GI endoscopy was accomplished without difficulty.                            The patient tolerated the procedure. Scope In: Scope Out: Findings:                 No gross lesions were noted in the entire                            esophagus. Biopsies were taken with a cold forceps                            for histology to rule out EoE/LoE.                           The Z-line was regular and was found 38 cm from the                            incisors.                           A 1 cm hiatal hernia was present.                           Multiple sessile polyps noted in body of stomach -                            likely fundic gland polyps. Biopsied/sampled 2 of                            these.                           Patchy moderate inflammation characterized by  erythema, friability and granularity was found in                            the gastric body and in the gastric antrum.                           One non-bleeding superficial gastric ulcer with a                            clean ulcer base (Forrest Class III) was found on                            the posterior wall of the gastric antrum. The                            lesion was 8 mm in largest dimension.                           No other gross lesions were noted in the entire                            examined stomach. Biopsies were taken with a cold                            forceps for histology and Helicobacter pylori                            testing.                           No gross lesions were noted in the duodenal bulb,                            in the first portion of the duodenum and in the                            second portion of the duodenum. Complications:            No immediate complications. Estimated Blood Loss:     Estimated  blood loss was minimal. Impression:               - No gross lesions in esophagus. Biopsied. Z-line                            regular, 38 cm from the incisors.                           - 1 cm hiatal hernia.                           - Likely fundic gland polyps noted.                            Biopsied/sampled 2 of these.                           -  Gastritis. Non-bleeding gastric ulcer with a                            clean ulcer base (Forrest Class III). No other                            gross lesions in the stomach. Biopsied.                           - No gross lesions in the duodenal bulb, in the                            first portion of the duodenum and in the second                            portion of the duodenum. Recommendation:           - Proceed to scheduled colonoscopy.                           - Observe patient's clinical course.                           - Await pathology results.                           - ENT referral is recommended to evaluate the upper                            oropharynx to rule out LPR, though she hasn't                            responded to PPI therapy.                           - Recommend increasing PPI to 40 mg twice daily to                            heal gastic ulcer and gastritis. Will also see if                            this makes any difference in the near future in                            regards to her symptoms of reported persistent                            GERD..                           - Repeat upper endoscopy in 3 months to check                            healing.                           -  If issues persist in follow up will consider pH                            impedence testing in future.                           - The findings and recommendations were discussed                            with the patient.                           - The findings and recommendations were discussed                             with the patient's family. Justice Britain, MD 02/22/2020 2:15:26 PM

## 2020-02-22 NOTE — Progress Notes (Signed)
CW vitals and AW Iv.

## 2020-02-22 NOTE — Progress Notes (Signed)
Called to room to assist during endoscopic procedure.  Patient ID and intended procedure confirmed with present staff. Received instructions for my participation in the procedure from the performing physician.  

## 2020-02-22 NOTE — Op Note (Signed)
[facility] Patient Name: [name] Procedure Date: 02/22/2020 1:25 PM MRN: [id number] Endoscopist: Justice Britain , MD Age: 50 Referring MD:  Date of Birth: Jan 04, 1971 Gender: Female Account #: [id number] Procedure:                Colonoscopy Indications:              Screening for colorectal malignant neoplasm Medicines:                Monitored Anesthesia Care Procedure:                Pre-Anesthesia Assessment:                           - Prior to the procedure, a History and Physical                            was performed, and patient medications and                            allergies were reviewed. The patient's tolerance of                            previous anesthesia was also reviewed. The risks                            and benefits of the procedure and the sedation                            options and risks were discussed with the patient.                            All questions were answered, and informed consent                            was obtained. Prior Anticoagulants: The patient has                            taken no previous anticoagulant or antiplatelet                            agents. ASA Grade Assessment: II - A patient with                            mild systemic disease. After reviewing the risks                            and benefits, the patient was deemed in                            satisfactory condition to undergo the procedure.                           After obtaining informed consent, the colonoscope  was passed under direct vision. Throughout the                            procedure, the patient's blood pressure, pulse, and                            oxygen saturations were monitored continuously. The                            Olympus PFC-[id number] (#[id number]) Colonoscope was                            introduced through the anus and advanced to the 5                            cm into the ileum. The  patient tolerated the                            procedure. The colonoscopy was performed without                            difficulty. The quality of the bowel preparation                            was adequate. The terminal ileum, ileocecal valve,                            appendiceal orifice, and rectum were photographed. Scope In: 1:43:33 PM Scope Out: 2:02:11 PM Scope Withdrawal Time: 0 hours 14 minutes 52 seconds  Total Procedure Duration: 0 hours 18 minutes 38 seconds  Findings:                 The digital rectal exam findings include                            hemorrhoids. Pertinent negatives include no                            palpable rectal lesions.                           The terminal ileum and ileocecal valve appeared                            normal.                           Three semi-sessile polyps were found in the                            ascending colon (2) and cecum (1). The polyps were                            3 to 10 mm in size. These polyps were removed with  a cold snare. Resection and retrieval were complete.                           A single small-mouthed diverticulum was found in                            the ascending colon.                           Normal mucosa was found in the entire colon                            otherwise.                           Non-bleeding non-thrombosed external and internal                            hemorrhoids were found during retroflexion, during                            perianal exam and during digital exam. The                            hemorrhoids were Grade II (internal hemorrhoids                            that prolapse but reduce spontaneously). Complications:            No immediate complications. Estimated Blood Loss:     Estimated blood loss was minimal. Impression:               - Hemorrhoids found on digital rectal exam.                           - The examined portion  of the ileum was normal.                           - Three 3 to 10 mm polyps in the ascending colon                            and in the cecum, removed with a cold snare.                            Resected and retrieved.                           - Diverticulum in the ascending colon.                           - Normal mucosa in the entire examined colon                            otherwise.                           -  Non-bleeding non-thrombosed external and internal                            hemorrhoids. Recommendation:           - The patient will be observed post-procedure,                            until all discharge criteria are met.                           - Discharge patient to home.                           - Patient has a contact number available for                            emergencies. The signs and symptoms of potential                            delayed complications were discussed with the                            patient. Return to normal activities tomorrow.                            Written discharge instructions were provided to the                            patient.                           - High fiber diet.                           - Use FiberCon 1-2 tablets PO daily.                           - Continue present medications.                           - Await pathology results.                           - Repeat colonoscopy in 3 years for surveillance.                           - The findings and recommendations were discussed                            with the patient.                           - The findings and recommendations were discussed                            with the patient's family. Justice Britain, MD 02/22/2020 2:22:42  PM

## 2020-02-22 NOTE — Patient Instructions (Signed)
YOU HAD AN ENDOSCOPIC PROCEDURE TODAY AT THE [facility]:   Refer to the procedure report that was given to you for any specific questions about what was found during the examination.  If the procedure report does not answer your questions, please call your gastroenterologist to clarify.  If you requested that your care partner not be given the details of your procedure findings, then the procedure report has been included in a sealed envelope for you to review at your convenience later.  YOU SHOULD EXPECT: Some feelings of bloating in the abdomen. Passage of more gas than usual.  Walking can help get rid of the air that was put into your GI tract during the procedure and reduce the bloating. If you had a lower endoscopy (such as a colonoscopy or flexible sigmoidoscopy) you may notice spotting of blood in your stool or on the toilet paper. If you underwent a bowel prep for your procedure, you may not have a normal bowel movement for a few days.  Please Note:  You might notice some irritation and congestion in your nose or some drainage.  This is from the oxygen used during your procedure.  There is no need for concern and it should clear up in a day or so.  SYMPTOMS TO REPORT IMMEDIATELY:   Following lower endoscopy (colonoscopy or flexible sigmoidoscopy):  Excessive amounts of blood in the stool  Significant tenderness or worsening of abdominal pains  Swelling of the abdomen that is new, acute  Fever of 100F or higher   Following upper endoscopy (EGD)  Vomiting of blood or coffee ground material  New chest pain or pain under the shoulder blades  Painful or persistently difficult swallowing  New shortness of breath  Fever of 100F or higher  Black, tarry-looking stools  For urgent or emergent issues, a gastroenterologist can be reached at any hour by calling [phone]. Do not use MyChart messaging for urgent concerns.    DIET:  We do recommend a small meal at first, but  then you may proceed to your regular diet.  Drink plenty of fluids but you should avoid alcoholic beverages for 24 hours.  ACTIVITY:  You should plan to take it easy for the rest of today and you should NOT DRIVE or use heavy machinery until tomorrow (because of the sedation medicines used during the test).    FOLLOW UP: Our staff will call the number listed on your records 48-72 hours following your procedure to check on you and address any questions or concerns that you may have regarding the information given to you following your procedure. If we do not reach you, we will leave a message.  We will attempt to reach you two times.  During this call, we will ask if you have developed any symptoms of COVID 19. If you develop any symptoms (ie: fever, flu-like symptoms, shortness of breath, cough etc.) before then, please call [phone].  If you test positive for Covid 19 in the 2 weeks post procedure, please call and report this information to us.    If any biopsies were taken you will be contacted by phone or by letter within the next 1-3 weeks.  Please call us at [phone] if you have not heard about the biopsies in 3 weeks.    SIGNATURES/CONFIDENTIALITY: You and/or your care partner have signed paperwork which will be entered into your electronic medical record.  These signatures attest to the fact that that the information above on   your After Visit Summary has been reviewed and is understood.  Full responsibility of the confidentiality of this discharge information lies with you and/or your care-partner. 

## 2020-02-22 NOTE — Progress Notes (Signed)
To PACU< VSS. Report to [email or web address].tb 

## 2020-02-24 ENCOUNTER — Other Ambulatory Visit: Payer: Self-pay

## 2020-02-24 ENCOUNTER — Telehealth: Payer: Self-pay | Admitting: *Deleted

## 2020-02-24 ENCOUNTER — Telehealth: Payer: Self-pay

## 2020-02-24 DIAGNOSIS — R1013 Epigastric pain: Secondary | ICD-10-CM

## 2020-02-24 NOTE — Telephone Encounter (Signed)
Inbound call from patient returning your call and is requesting a call back please; states she is having some issues.

## 2020-02-24 NOTE — Telephone Encounter (Signed)
I called and spoke with the patient this afternoon.  She describes a discomfort when she presses on her chest as well.  If she massages it it has some mild improvement.  The swallowing issue is not as completely defined as well.  She did not have a dilation and simple biopsies were obtained.  Not clear why she is having that issue at this point but agree with her needing to reinitiate her PPI at twice daily dosing. I have told her that it would be ideal for her to go ahead and come in for x-rays of the chest and abdomen to ensure that there are no postprocedural complications however, she is already on her way to a ski trip this weekend. I told her that if things worsen or progress she needs to be evaluated wherever she is at through urgent care or emergency department if she has severe symptoms otherwise hopefully things will abate over the coming days. She can use Tylenol as needed or if necessary ibuprofen as long as she is taking her PPI. Hopefully she will do well but if she is still having issues by Monday and she has not been evaluated over the weekend then we should obtain imaging  Patty, please put on your radar for Monday to reach out to the patient and see how she is doing. Also go ahead and place a chest x-ray (PA and lateral) and a KUB (3 view supine upright and decubitus) orders into the chart so that she can come in on Monday and have those done if she is still having issues. Would also have laboratories pending for her including a CBC/CMP that can be done if she still having issues.  She is aware of the symptoms that require additional work-up or evaluation urgently over the weekend if that occurs.  Thank you. GM

## 2020-02-24 NOTE — Telephone Encounter (Signed)
Spoke with patient and she is having mid epigastric pain 5/10 when she drinks and eats. Reviewed the findings of the EGD with her and discussed that Gastric ulcer. She had not restarted taking her Omeprazole twice a day as Dr. Rush Landmark suggested. Encouraged her to start this but she's not sure that she can "even Swallow that." Will make Dr. Rush Landmark aware.

## 2020-02-24 NOTE — Telephone Encounter (Signed)
LVM

## 2020-02-24 NOTE — Telephone Encounter (Signed)
Labs and xray orders entered will call pt on Monday to get an update.

## 2020-02-27 ENCOUNTER — Telehealth: Payer: Self-pay

## 2020-02-27 NOTE — Telephone Encounter (Signed)
Thank you for the update. GM 

## 2020-02-27 NOTE — Telephone Encounter (Signed)
I placed another call to the pt and she tells me that she does not wish to proceed with xray or labs. She says the pain has resolved and she feels fine today. I asked that she call back if her symptoms return.  The pt has been advised of the information and verbalized understanding.

## 2020-02-27 NOTE — Telephone Encounter (Signed)
-----   Message from Timothy Lasso, RN sent at 02/24/2020  1:05 PM EST ----- Get pt condition update see 2/18 note

## 2020-02-27 NOTE — Telephone Encounter (Signed)
Placed a call to the pt and no answer and voice mail is full. I will try again later

## 2020-03-02 ENCOUNTER — Encounter: Payer: Self-pay | Admitting: Gastroenterology

## 2020-03-30 ENCOUNTER — Telehealth: Payer: Self-pay | Admitting: Gastroenterology

## 2020-03-30 NOTE — Telephone Encounter (Signed)
Pt states that Omeprazole is not working well. She would like to try something else. Pls call her.

## 2020-03-30 NOTE — Telephone Encounter (Signed)
The pt states that the omeprazole 40 mg twice daily does not seem to work as well as Nexium.  She was on Nexium in the past and believes it worked better.  She also asked if she could have liquid Carafate prescribed.  Dr Rush Landmark is it ok to send in carafate and change PPI to Nexium?

## 2020-04-02 MED ORDER — ESOMEPRAZOLE MAGNESIUM 40 MG PO CPDR: 40.0000 mg | DELAYED_RELEASE_CAPSULE | Freq: Two times a day (BID) | ORAL | 3 refills | Status: DC

## 2020-04-02 MED ORDER — SUCRALFATE 1 GM/10ML PO SUSP: 1.0000 g | Freq: Three times a day (TID) | ORAL | 1 refills | Status: DC

## 2020-04-02 NOTE — Telephone Encounter (Signed)
I am okay for transition from omeprazole back to Nexium.  Also okay for liquid Carafate before every meal plus nightly.  Thanks. GM

## 2020-04-02 NOTE — Telephone Encounter (Signed)
Prescriptions sent and pt aware.

## 2020-04-09 ENCOUNTER — Telehealth: Payer: Self-pay | Admitting: Gastroenterology

## 2020-04-09 MED ORDER — SUCRALFATE 1 G PO TABS: 1.0000 g | ORAL_TABLET | Freq: Three times a day (TID) | ORAL | 1 refills | Status: DC

## 2020-04-09 NOTE — Telephone Encounter (Signed)
Inbound call from patient stating Nexium and liquid Carafate require a prior authorization.  Please advise.

## 2020-04-09 NOTE — Telephone Encounter (Signed)
PA has been started on Cover My meds. Will reach to patient once decision has been made by ins.

## 2020-04-09 NOTE — Telephone Encounter (Signed)
Ins denied Carafate Suspension. Pt has been informed that rx will be changed to pill form with instructions on how to make a slurry. No decision has been made regarding Nexium at this time. I will send new rx for Carafate tablets. Pt will be informed of decision regarding Nexium once we have the information. Pt voiced understanding.

## 2020-04-26 NOTE — Addendum Note (Signed)
Addended byDebbe Mounts on: 04/26/2020 10:35 AM   Modules accepted: Orders

## 2020-04-26 NOTE — Telephone Encounter (Signed)
Ins denied Nexium. Appeal was denied as well. Pt has been taking over the counter Nexium and states that she will continue to take OTC. Dr. Rush Landmark recommendations is for pt to have EGD in May 2022. There is a recall in the system for this. I offered to schedule while we were on the phone, but pt states that she will call me back and let me know if she decides to schedule. Pt states that last procedure was painful and she is not sure if she wants to proceed with another one right now. Pt will call me back and let me know.

## 2020-06-04 ENCOUNTER — Other Ambulatory Visit: Payer: Self-pay | Admitting: Gastroenterology

## 2020-07-26 DIAGNOSIS — L247 Irritant contact dermatitis due to plants, except food: Secondary | ICD-10-CM | POA: Diagnosis not present

## 2020-07-26 DIAGNOSIS — K219 Gastro-esophageal reflux disease without esophagitis: Secondary | ICD-10-CM | POA: Diagnosis not present

## 2020-08-08 DIAGNOSIS — L237 Allergic contact dermatitis due to plants, except food: Secondary | ICD-10-CM | POA: Diagnosis not present

## 2020-09-07 ENCOUNTER — Encounter: Payer: Self-pay | Admitting: Gastroenterology

## 2020-09-20 ENCOUNTER — Telehealth: Payer: Self-pay | Admitting: Gastroenterology

## 2020-09-20 NOTE — Telephone Encounter (Signed)
Hi Dr. Havery Moros,   Would yo approve this transfer of care request?

## 2020-09-20 NOTE — Telephone Encounter (Signed)
I had been aware of some issues right after the procedure, but as documented in the chart, she also stated within just a few days the pain had resolved and she never came in for the further evaluation I had discussed with her.   In either case, I have no issue if patient wishes for a transfer of care, but ultimately it is at the discretion of the other provider. GM.

## 2020-09-20 NOTE — Telephone Encounter (Signed)
Hi Dr. Rush Landmark,  Patient called requesting a transfer of care over to Dr. Havery Moros due to some discomfort she felt in her throat for a few weeks when she had her EGD procedure. Not sure if it was brought to your attention then.   Please advise on scheduling.  Thank you

## 2020-09-20 NOTE — Telephone Encounter (Signed)
I defer to Dr. Rush Landmark on this. Sore throat can occur after endoscopy and not uncommon, would not think that a reason to transfer care, Dr. Rush Landmark has taken good care of her. That being said, happy to see this patient if that is her preference and to keep her within our practice.

## 2020-09-26 ENCOUNTER — Encounter: Payer: Self-pay | Admitting: Gastroenterology

## 2020-09-26 ENCOUNTER — Telehealth: Payer: Self-pay | Admitting: Gastroenterology

## 2020-09-26 NOTE — Telephone Encounter (Signed)
Inbound call from pt requesting a call back stating she wanted to ask a question regarding getting an EGD before scheduling. Please advise. Thank you.

## 2020-09-26 NOTE — Telephone Encounter (Signed)
[name] see note from Dr Havery Moros.  Pt needs EGD with him.  He has ok'd switch from Dr Rush Landmark.

## 2020-10-19 ENCOUNTER — Ambulatory Visit (AMBULATORY_SURGERY_CENTER): Payer: BC Managed Care – PPO | Admitting: *Deleted

## 2020-10-19 ENCOUNTER — Other Ambulatory Visit: Payer: Self-pay

## 2020-10-19 VITALS — Ht 64.0 in | Wt 135.0 lb

## 2020-10-19 DIAGNOSIS — K219 Gastro-esophageal reflux disease without esophagitis: Secondary | ICD-10-CM

## 2020-10-19 NOTE — Progress Notes (Signed)
Pre-visit conducted over telephone. Instructions sent through e-mail [email or web address]   No egg or soy allergy known to patient  No issues known to pt with past sedation with any surgeries or procedures Patient denies ever being told they had issues or difficulty with intubation  No FH of Malignant Hyperthermia Pt is not on diet pills Pt is not on  home 02  Pt is not on blood thinners  Pt denies issues with constipation  No A fib or A flutter  Pt is fully vaccinated  for Covid      Due to the COVID-19 pandemic we are asking patients to follow certain guidelines in [place] and the [place]   Pt aware of COVID protocols and [facility] guidelines

## 2020-11-01 ENCOUNTER — Encounter: Payer: Self-pay | Admitting: Gastroenterology

## 2020-11-01 ENCOUNTER — Ambulatory Visit (AMBULATORY_SURGERY_CENTER): Payer: BC Managed Care – PPO | Admitting: Gastroenterology

## 2020-11-01 VITALS — BP 100/57 | HR 65 | Temp 97.5°F | Resp 12 | Ht 64.5 in | Wt 135.0 lb

## 2020-11-01 DIAGNOSIS — K2950 Unspecified chronic gastritis without bleeding: Secondary | ICD-10-CM | POA: Diagnosis not present

## 2020-11-01 DIAGNOSIS — K219 Gastro-esophageal reflux disease without esophagitis: Secondary | ICD-10-CM

## 2020-11-01 DIAGNOSIS — K317 Polyp of stomach and duodenum: Secondary | ICD-10-CM | POA: Diagnosis not present

## 2020-11-01 DIAGNOSIS — K2970 Gastritis, unspecified, without bleeding: Secondary | ICD-10-CM | POA: Diagnosis not present

## 2020-11-01 DIAGNOSIS — K259 Gastric ulcer, unspecified as acute or chronic, without hemorrhage or perforation: Secondary | ICD-10-CM | POA: Diagnosis not present

## 2020-11-01 DIAGNOSIS — K449 Diaphragmatic hernia without obstruction or gangrene: Secondary | ICD-10-CM | POA: Diagnosis not present

## 2020-11-01 DIAGNOSIS — K31A0 Gastric intestinal metaplasia, unspecified: Secondary | ICD-10-CM

## 2020-11-01 MED ORDER — SODIUM CHLORIDE 0.9 % IV SOLN: 500.0000 mL | Freq: Once | INTRAVENOUS | Status: DC

## 2020-11-01 NOTE — Patient Instructions (Signed)
Await pathology results Hiatal hernia handout provided   YOU HAD AN ENDOSCOPIC PROCEDURE TODAY AT [facility]:   Refer to the procedure report that was given to you for any specific questions about what was found during the examination.  If the procedure report does not answer your questions, please call your gastroenterologist to clarify.  If you requested that your care partner not be given the details of your procedure findings, then the procedure report has been included in a sealed envelope for you to review at your convenience later.  YOU SHOULD EXPECT: Some feelings of bloating in the abdomen. Passage of more gas than usual.  Walking can help get rid of the air that was put into your GI tract during the procedure and reduce the bloating. If you had a lower endoscopy (such as a colonoscopy or flexible sigmoidoscopy) you may notice spotting of blood in your stool or on the toilet paper. If you underwent a bowel prep for your procedure, you may not have a normal bowel movement for a few days.  Please Note:  You might notice some irritation and congestion in your nose or some drainage.  This is from the oxygen used during your procedure.  There is no need for concern and it should clear up in a day or so.  SYMPTOMS TO REPORT IMMEDIATELY:  Following upper endoscopy (EGD)  Vomiting of blood or coffee ground material  New chest pain or pain under the shoulder blades  Painful or persistently difficult swallowing  New shortness of breath  Fever of 100F or higher  Black, tarry-looking stools  For urgent or emergent issues, a gastroenterologist can be reached at any hour by calling [phone]. Do not use MyChart messaging for urgent concerns.    DIET:  We do recommend a small meal at first, but then you may proceed to your regular diet.  Drink plenty of fluids but you should avoid alcoholic beverages for 24 hours.  ACTIVITY:  You should plan to take it easy for the rest of  today and you should NOT DRIVE or use heavy machinery until tomorrow (because of the sedation medicines used during the test).    FOLLOW UP: Our staff will call the number listed on your records 48-72 hours following your procedure to check on you and address any questions or concerns that you may have regarding the information given to you following your procedure. If we do not reach you, we will leave a message.  We will attempt to reach you two times.  During this call, we will ask if you have developed any symptoms of COVID 19. If you develop any symptoms (ie: fever, flu-like symptoms, shortness of breath, cough etc.) before then, please call [phone].  If you test positive for Covid 19 in the 2 weeks post procedure, please call and report this information to [place].    If any biopsies were taken you will be contacted by phone or by letter within the next 1-3 weeks.  Please call us at [phone] if you have not heard about the biopsies in 3 weeks.    SIGNATURES/CONFIDENTIALITY: You and/or your care partner have signed paperwork which will be entered into your electronic medical record.  These signatures attest to the fact that that the information above on your After Visit Summary has been reviewed and is understood.  Full responsibility of the confidentiality of this discharge information lies with you and/or your care-partner.

## 2020-11-01 NOTE — Progress Notes (Signed)
To PACU, VSS. Report to [email or web address] 

## 2020-11-01 NOTE — Progress Notes (Signed)
[facility] History and Physical   Primary Care Physician:  Tammi Sou, MD   Reason for Procedure:   Follow up gastric ulcer, GIM, history of GERD  Plan:    EGD with biopsies of the stomach     HPI: [name] is a 50 y.o. female  here for EGD surveillance. Had an EGD with Dr. Rush Landmark in February - showing gastric ulcer. Biopsies negative for H pylori but positive for GIM.  On nexium twice daily with fair control of reflux symptoms.  Father had esophageal cancer. Otherwise feels well without any cardiopulmonary symptoms. No pain today. She had some chest pain after her last endoscopy that resolved without intervention after a few days. We discussed risks / benefits of the exam today and she wishes to proceed.   Past Medical History:  Diagnosis Date   Allergy    Arthritis    Cervicogenic headache    Family history of colon cancer    GERD (gastroesophageal reflux disease)    Shortness of breath 07/2015   likely anxiety-related   Vitamin D deficiency 08/09/2015    Past Surgical History:  Procedure Laterality Date   [place], 2012   x 2   COLONOSCOPY  12/2010   NORMAL: repeat 5 yrs due to [place]   ESOPHAGOGASTRODUODENOSCOPY  12/2010   NORMAL.  Esoph bx NORMAL    Prior to Admission medications   Medication Sig Start Date End Date Taking? Authorizing Provider  calcium carbonate (TUMS EX) 750 MG chewable tablet Chew 1 tablet by mouth as needed for heartburn.   Yes [provider]  Esomeprazole Magnesium (NEXIUM 24HR PO) Take by mouth.   Yes [provider]  sucralfate (CARAFATE) 1 g tablet TAKE 1 TABLET (1 G TOTAL) BY MOUTH 4 (FOUR) TIMES DAILY - WITH MEALS AND AT BEDTIME. 06/05/20  Yes Mansouraty, Telford Nab., MD  ketoconazole (NIZORAL) 2 % cream 1 application 08/06/17   [provider]  PRESCRIPTION MEDICATION See admin instructions. 01/07/18   [provider]    Current Outpatient Medications  Medication Sig  Dispense Refill   calcium carbonate (TUMS EX) 750 MG chewable tablet Chew 1 tablet by mouth as needed for heartburn.     Esomeprazole Magnesium (NEXIUM 24HR PO) Take by mouth.     sucralfate (CARAFATE) 1 g tablet TAKE 1 TABLET (1 G TOTAL) BY MOUTH 4 (FOUR) TIMES DAILY - WITH MEALS AND AT BEDTIME. 90 tablet 1   ketoconazole (NIZORAL) 2 % cream 1 application     PRESCRIPTION MEDICATION See admin instructions.     Current Facility-Administered Medications  Medication Dose Route Frequency Provider Last Rate Last Admin   0.9 %  sodium chloride infusion  500 mL Intravenous Once Waylin Dorko, Carlota Raspberry, MD        Allergies as of 11/01/2020 - Review Complete 11/01/2020  Allergen Reaction Noted   Penicillins  12/17/2010    Family History  Problem Relation Age of Onset   Arthritis Mother    Diabetes Mother    Hypertension Mother    Hyperlipidemia Mother    Heart disease Mother    Esophageal cancer Father    Pancreatic cancer Paternal Aunt    Colon cancer Cousin    Rectal cancer Neg Hx    Stomach cancer Neg Hx     Social History   Socioeconomic History   Marital status: Married    Spouse name: Not on file   Number of children: 2   Years of  education: Not on file   Highest education level: Not on file  Occupational History   Occupation: Finance/analyst    Employer: [employer]  Tobacco Use   Smoking status: Never   Smokeless tobacco: Never  Vaping Use   Vaping Use: Never used  Substance and Sexual Activity   Alcohol use: No   Drug use: No   Sexual activity: Not on file  Other Topics Concern   Not on file  Social History Narrative   Married, 2 children.   Lives in [place].   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   No T/A/Ds.   Social Determinants of Health   Financial Resource Strain: Not on file  Food Insecurity: Not on file  Transportation Needs: Not on file  Physical Activity: Not on file  Stress: Not on file  Social Connections: Not on file  Intimate Partner  Violence: Not on file    Review of Systems: All other review of systems negative except as mentioned in the HPI.  Physical Exam: Vital signs BP 100/63 (Patient Position: Sitting)   Pulse 75   Temp (!) 97.5 F (36.4 C)   Ht 5' 4.5" (1.638 m)   Wt 135 lb (61.2 kg)   SpO2 100%   BMI 22.81 kg/m   General:   Alert,  Well-developed,pleasant and cooperative in NAD Lungs:  Clear throughout to auscultation.   Heart:  Regular rate and rhythm Abdomen:  Soft, nontender and nondistended.   Neuro/Psych:  Alert and cooperative. Normal mood and affect. A and O x 3  Jolly Mango, MD [facility]

## 2020-11-01 NOTE — Op Note (Signed)
[facility] Patient Name: [name] Procedure Date: 11/01/2020 9:43 AM MRN: [id number] Endoscopist: Remo Lipps P. Havery [name] , MD Age: 50 Referring MD:  Date of Birth: [birth year] Gender: Female Account #: [id number] Procedure:                Upper GI endoscopy Indications:              Follow-up of gastric ulcer and gastric intestinal                            metaplasia noted on last exam in Feb 2022 - on                            nexium twice daily for reflux which does a fair job                            of controlling symptoms Medicines:                Monitored Anesthesia Care Procedure:                Pre-Anesthesia Assessment:                           - Prior to the procedure, a History and Physical                            was performed, and patient medications and                            allergies were reviewed. The patient's tolerance of                            previous anesthesia was also reviewed. The risks                            and benefits of the procedure and the sedation                            options and risks were discussed with the patient.                            All questions were answered, and informed consent                            was obtained. Prior Anticoagulants: The patient has                            taken no previous anticoagulant or antiplatelet                            agents. ASA Grade Assessment: II - A patient with                            mild systemic disease. After reviewing the risks  and benefits, the patient was deemed in                            satisfactory condition to undergo the procedure.                           After obtaining informed consent, the endoscope was                            passed under direct vision. Throughout the                            procedure, the patient's blood pressure, pulse, and                            oxygen saturations were monitored  continuously. The                            GIF [id number] #[id number] was introduced through the                            mouth, and advanced to the second part of duodenum.                            The upper GI endoscopy was accomplished without                            difficulty. The patient tolerated the procedure                            well. Scope In: Scope Out: Findings:                 Esophagogastric landmarks were identified: the                            Z-line was found at 37 cm, the gastroesophageal                            junction was found at 37 cm and the upper extent of                            the gastric folds was found at 38 cm from the                            incisors. Z line regular.                           A 1 cm hiatal hernia was present.                           The exam of the esophagus was otherwise normal. No  erosive changes.                           A few small sessile polyps were found in the                            gastric body, grossly consistent with benign fundic                            gland polyps. These were biopsied on the last exam                            in February and fundic gland polyps, no further                            biopsies taken today.                           The exam of the stomach was otherwise normal.                            Interval healing of gastric ulcer noted.                           Biopsies were taken with a cold forceps in the                            gastric fundus (jar 3), in the gastric body (jar                            2), at the incisura and in the gastric antrum (jar                            1) for histology to assess for gastric intestinal                            metaplasia given history of this on prior biopsies.                           The duodenal bulb and second portion of the                            duodenum were normal. Complications:             No immediate complications. Estimated blood loss:                            Minimal. Estimated Blood Loss:     Estimated blood loss was minimal. Impression:               - Esophagogastric landmarks identified.                           - 1 cm hiatal hernia.                           -  Normal esophagus otherwise                           - A few benign gastric fundic gland polyps,                            unchanged from the prior exam.                           - Interval healing of prior gastric ulcer                           - Normal stomach otherwise - biopsies taken to                            survey / map for gastric intestinal metaplasia                           - Normal duodenal bulb and second portion of the                            duodenum. Recommendation:           - Patient has a contact number available for                            emergencies. The signs and symptoms of potential                            delayed complications were discussed with the                            patient. Return to normal activities tomorrow.                            Written discharge instructions were provided to the                            patient.                           - Resume previous diet.                           - Continue present medications including nexium                            twice daily                           - Await pathology results with further                            recommendations Remo Lipps P. Shatyra Becka, MD 11/01/2020 10:17:35 AM This report has been signed electronically.

## 2020-11-01 NOTE — Progress Notes (Signed)
CHECK-IN-AM ° °V/S-CW °

## 2020-11-01 NOTE — Progress Notes (Signed)
Called to room to assist during endoscopic procedure.  Patient ID and intended procedure confirmed with present staff. Received instructions for my participation in the procedure from the performing physician.  

## 2020-11-06 ENCOUNTER — Telehealth: Payer: Self-pay

## 2020-11-06 NOTE — Telephone Encounter (Signed)
Left message on follow up call. 

## 2020-12-10 ENCOUNTER — Ambulatory Visit (INDEPENDENT_AMBULATORY_CARE_PROVIDER_SITE_OTHER): Payer: BC Managed Care – PPO | Admitting: Gastroenterology

## 2020-12-10 ENCOUNTER — Encounter: Payer: Self-pay | Admitting: Gastroenterology

## 2020-12-10 VITALS — BP 100/70 | HR 73 | Ht 63.0 in | Wt 132.0 lb

## 2020-12-10 DIAGNOSIS — Z8711 Personal history of peptic ulcer disease: Secondary | ICD-10-CM

## 2020-12-10 DIAGNOSIS — K219 Gastro-esophageal reflux disease without esophagitis: Secondary | ICD-10-CM | POA: Diagnosis not present

## 2020-12-10 DIAGNOSIS — Z79899 Other long term (current) drug therapy: Secondary | ICD-10-CM

## 2020-12-10 MED ORDER — PANTOPRAZOLE SODIUM 40 MG PO TBEC: 40.0000 mg | DELAYED_RELEASE_TABLET | Freq: Two times a day (BID) | ORAL | 1 refills | Status: AC

## 2020-12-10 MED ORDER — SUCRALFATE 1 G PO TABS: 1.0000 g | ORAL_TABLET | Freq: Three times a day (TID) | ORAL | 1 refills | Status: DC | PRN

## 2020-12-10 NOTE — Patient Instructions (Addendum)
If you are age 52 or older, your body mass index should be between 23-30. Your Body mass index is 23.38 kg/m. If this is out of the aforementioned range listed, please consider follow up with your Primary Care Provider.  If you are age 108 or younger, your body mass index should be between 19-25. Your Body mass index is 23.38 kg/m. If this is out of the aformentioned range listed, please consider follow up with your Primary Care Provider.   ________________________________________________________  The [facility] providers would like to encourage you to use [facility] to communicate with providers for non-urgent requests or questions.  Due to long hold times on the telephone, sending your provider a message by [facility] may be a faster and more efficient way to get a response.  Please allow 48 business hours for a response.  Please remember that this is for non-urgent requests.  _______________________________________________________  We have sent the following medications to your pharmacy for you to pick up at your convenience: Carafate: Take as needed Pantoprazole (Protonix) 40 mg: Take once daily 30 to 60 minutes before a meal  Please give [place] an update in 1 month via MyChart.  Thank you.  Thank you for entrusting me with your care and for choosing [facility], Dr. Sherando Cellar

## 2020-12-10 NOTE — Progress Notes (Signed)
HPI :  50 year old female here for a follow-up visit for GERD, history of gastric ulcer, history of gastric intestinal metaplasia.  She was previously seen by Dr. Sharlett Iles and then Dr. Rush Landmark, switched to me a few months ago.  Recall she had a normal EGD with Dr. Sharlett Iles in 2012, she subsequently had an EGD with Dr. Rush Landmark in February 2022.  She had an 8 mm gastric ulcer noted along with gastritis on that exam, and a small hiatal hernia.  Biopsies showed no evidence of H. pylori but she did have some focal gastric intestinal metaplasia.  She had been on Nexium for the ulcer in her reflux and had a follow-up EGD with me October 27 of this year.  She had interval healing of the gastric ulcer.  Some stable benign-appearing fundic gland polyps.  Gastric mapping biopsies done to assess for gastric intestinal metaplasia.  Biopsies were negative for H. pylori.  Biopsies were also negative for any gastric intestinal metaplasia.  Her father had esophageal cancer diagnosed age 77s but no history of gastric cancer.  She has several questions about long-term plan for management of her reflux, surveillance of gastric ulcer/gastric intestinal metaplasia.  She states her reflux symptoms are intermittent, but she frequently has a bitter taste in her mouth and regurgitation that bothers her for the most part.  She had did have some pyrosis in the past but that has been better lately.  She has not been taking Nexium every day, states she takes it as needed but has symptoms on most days of the week.  She previously was taking it upwards of twice daily but states it did not really control her regurgitation and bitter taste in her mouth.  She denies any nausea vomiting, or abdominal pains.  She takes Carafate as needed and that seems to help her more than the Nexium.  She has been on Prevacid remotely as well.  She denies any history of chronic kidney disease or osteoporosis that is known.  Prior work-up EGD  02/25/2010 by Dr. Sharlett Iles: Normal EGD.   Colonoscopy 02/25/2010: Normal colonoscopy 5-year recall due to family history of colon cancer   CTAP 08/01/2016 1. No acute abnormalities or suggested etiology for clinical symptoms.  2. Small left lobe hepatic hemangioma   EGD 02/22/20 - Dr. Rush Landmark - The Z-line was regular and was found 38 cm from the incisors. - A 1 cm hiatal hernia was present. - Multiple sessile polyps noted in body of stomach - likely fundic gland polyps. Biopsied/sampled 2 of these. Patchy moderate inflammation characterized by erythema, friability and granularity was found in the gastric body and in the gastric antrum. - One non-bleeding superficial gastric ulcer with a clean ulcer base (Forrest Class III) was found on the posterior wall of the gastric antrum. The lesion was 8 mm in largest dimension. - No other gross lesions were noted in the entire examined stomach. Biopsies were taken with a cold forceps for histology and Helicobacter pylori testing. - No gross lesions were noted in the duodenal bulb, in the first portion of the duodenum and in the second portion of the duodenum.  Colonoscopy 02/22/20: - The terminal ileum and ileocecal valve appeared normal. - Three semi-sessile polyps were found in the ascending colon (2) and cecum (1). The polyps were 3 to 10 mm in size. These polyps were removed with a cold snare. Resection and retrieval were complete. - A single small-mouthed diverticulum was found in the ascending colon. - Normal mucosa  was found in the entire colon otherwise. - Non-bleeding non-thrombosed external and internal hemorrhoids were found during retroflexion, during perianal exam and during digital exam. The hemorrhoids were Grade II (internal hemorrhoids that prolapse but reduce spontaneously).  1. Surgical [P], gastric biopsies - GASTRIC ANTRAL AND OXYNTIC MUCOSA WITH MILD CHRONIC GASTRITIS AND SURFACE EROSION IN ANTRUM. - INTESTINAL  METAPLASIA IS PRESENT FOCALLY IN ANTRUM, NEGATIVE FOR DYSPLASIA. - WARTHIN-STARRY STAIN IS NEGATIVE FOR HELICOBACTER PYLORI. 2. Surgical [P], gastric polyps biopsies - FUNDIC GLAND POLYPS. 3. Surgical [P], random esophageal biopsies - SQUAMOUS ESOPHAGEAL EPITHELIUM WITH NO SIGNIFICANT PATHOLOGIC FINDINGS. - NEGATIVE FOR INCREASED INTRAEPITHELIAL EOSINOPHILS. 4. Surgical [P], colon, ascending and cecum, polyp (3) - TUBULAR ADENOMA (X3). - NEGATIVE FOR HIGH GRADE DYSPLASIA.  EGD 11/01/20: - A 1 cm hiatal hernia was present. - The exam of the esophagus was otherwise normal. No erosive changes. - A few small sessile polyps were found in the gastric body, grossly consistent with benign fundic gland polyps. These were biopsied on the last exam in February and fundic gland polyps, no further biopsies taken today. - The exam of the stomach was otherwise normal. Interval healing of gastric ulcer noted. - Biopsies were taken with a cold forceps in the gastric fundus (jar 3), in the gastric body (jar 2), at the incisura and in the gastric antrum (jar 1) for histology to assess for gastric intestinal metaplasia given history of this on prior biopsies. - The duodenal bulb and second portion of the duodenum were normal.  1. Surgical [P], gastric antrum/incisura - MILD CHRONIC GASTRITIS WITHOUT ACTIVITY - NO H. PYLORI OR INTESTINAL METAPLASIA IDENTIFIED - SEE COMMENT 2. Surgical [P], gastric body - BENIGN GASTRIC MUCOSA - NO H. PYLORI, INTESTINAL METAPLASIA OR MALIGNANCY IDENTIFIED 3. Surgical [P], fundus - BENIGN GASTRIC MUCOSA - NO H. PYLORI, INTESTINAL METAPLASIA OR MALIGNANCY IDENTIFIED   Past Medical History:  Diagnosis Date   Allergy    Arthritis    Cervicogenic headache    Family history of colon cancer    GERD (gastroesophageal reflux disease)    Shortness of breath 07/2015   likely anxiety-related   Vitamin D deficiency 08/09/2015     Past Surgical History:  Procedure  Laterality Date   [place], 2012   x 2   COLONOSCOPY  12/2010   NORMAL: repeat 5 yrs due to [place]   ESOPHAGOGASTRODUODENOSCOPY  12/2010   NORMAL.  Esoph bx NORMAL   Family History  Problem Relation Age of Onset   Arthritis Mother    Diabetes Mother    Hypertension Mother    Hyperlipidemia Mother    Heart disease Mother    Heart attack Mother    Esophageal cancer Father    Pancreatic cancer Paternal Aunt    Colon cancer Cousin    Rectal cancer Neg Hx    Stomach cancer Neg Hx    Social History   Tobacco Use   Smoking status: Never   Smokeless tobacco: Never  Vaping Use   Vaping Use: Never used  Substance Use Topics   Alcohol use: No   Drug use: No   Current Outpatient Medications  Medication Sig Dispense Refill   esomeprazole (NEXIUM) 40 MG capsule Take 40 mg by mouth daily as needed.     sucralfate (CARAFATE) 1 g tablet TAKE 1 TABLET (1 G TOTAL) BY MOUTH 4 (FOUR) TIMES DAILY - WITH MEALS AND AT BEDTIME. (Patient taking differently: Take 1 g by mouth 4 (four) times daily -  with meals and at bedtime. Uses prn) 90 tablet 1   No current facility-administered medications for this visit.   Allergies  Allergen Reactions   Penicillins      Review of Systems: All systems reviewed and negative except where noted in HPI.    Physical Exam: BP 100/70   Pulse 73   Ht 5\' 3"  (1.6 m)   Wt 132 lb (59.9 kg)   LMP 01/02/2020   BMI 23.38 kg/m  Constitutional: Pleasant,well-developed, female in no acute distress. Neurological: Alert and oriented to person place and time. Skin: Skin is warm and dry. No rashes noted. Psychiatric: Normal mood and affect. Behavior is normal.   ASSESSMENT AND PLAN: 50 year old female here for reassessment of the following:  GERD Long-term use of PPI History of gastric ulcer / history of gastric intestinal metaplasia  Longstanding symptoms of reflux, to include pyrosis, regurgitation, waterbrash.  Pyrosis seems fairly well  controlled but still having a fair amount of regurgitation and waterbrash, she states no improvement despite twice daily Nexium, has been using it only as needed.  We discussed options.  She has not tried a different PPI in some time, we will switch her to Protonix 40 mg once daily, take 30 to 60 minutes prior to meal.  She can increase this to twice daily if needed.  I like to hear from her in 1 months to see how she is feeling.  If no improvement in symptoms persist despite max dose PPI may consider a 24-hour pH impedance study.  We briefly discussed that today and she wishes to avoid that if at all possible.  Carafate does help her when used as needed, we will refill that for her today.  Counseled on long-term use of PPI, long-term want to use lowest dose needed to control her symptoms.  We discussed risks of PPI to include chronic kidney disease, increased risk for bone fracture, etc., she will inquire with her PCP about baseline DEXA scan.  Further recommendations pending her course over the next month.  Of note reassured her that her gastric ulcer has healed and gastric mapping study showed no evidence of gastric intestinal metaplasia, this is good news.  She inquired about timing of next surveillance exam. We discussed gastric intestinal metaplasia in general and given this was not noted on her recent gastric mapping study, she does not warrant surveillance for this, reason to do endoscopy moving forward will be based on symptoms.  Plan: - stop nexium 40mg  - start pantoprazole 40mg  daily - take 30 to 60 min prior to eating. Contact me in 1 month with anupdate. Can increase to BID if needed - refill carafate to use PRN - consider pH test if no improvement  - counseled on risks of PPIs and course to date  I spent 35 minutes of time, including in depth chart review, face-to-face time with the patient, and documenting this encounter.   Jolly Mango, MD [facility]

## 2020-12-13 DIAGNOSIS — Z13820 Encounter for screening for osteoporosis: Secondary | ICD-10-CM | POA: Diagnosis not present

## 2020-12-13 DIAGNOSIS — Z0000 Encounter for general adult medical examination without abnormal findings: Secondary | ICD-10-CM | POA: Diagnosis not present

## 2020-12-13 DIAGNOSIS — R718 Other abnormality of red blood cells: Secondary | ICD-10-CM | POA: Diagnosis not present

## 2020-12-13 DIAGNOSIS — R739 Hyperglycemia, unspecified: Secondary | ICD-10-CM | POA: Diagnosis not present

## 2021-02-05 DIAGNOSIS — N952 Postmenopausal atrophic vaginitis: Secondary | ICD-10-CM | POA: Diagnosis not present

## 2021-02-05 DIAGNOSIS — Z01419 Encounter for gynecological examination (general) (routine) without abnormal findings: Secondary | ICD-10-CM | POA: Diagnosis not present

## 2021-02-05 DIAGNOSIS — Z6822 Body mass index (BMI) 22.0-22.9, adult: Secondary | ICD-10-CM | POA: Diagnosis not present

## 2021-03-06 DIAGNOSIS — F419 Anxiety disorder, unspecified: Secondary | ICD-10-CM | POA: Diagnosis not present

## 2021-03-06 DIAGNOSIS — F329 Major depressive disorder, single episode, unspecified: Secondary | ICD-10-CM | POA: Diagnosis not present

## 2021-03-13 ENCOUNTER — Other Ambulatory Visit: Payer: Self-pay | Admitting: Gastroenterology

## 2021-04-03 DIAGNOSIS — L409 Psoriasis, unspecified: Secondary | ICD-10-CM | POA: Diagnosis not present

## 2021-04-22 DIAGNOSIS — G479 Sleep disorder, unspecified: Secondary | ICD-10-CM | POA: Diagnosis not present

## 2021-05-15 ENCOUNTER — Other Ambulatory Visit: Payer: Self-pay | Admitting: Obstetrics and Gynecology

## 2021-05-15 DIAGNOSIS — Z1231 Encounter for screening mammogram for malignant neoplasm of breast: Secondary | ICD-10-CM

## 2021-05-16 ENCOUNTER — Ambulatory Visit
Admission: RE | Admit: 2021-05-16 | Discharge: 2021-05-16 | Disposition: A | Discharge status: Home / Self Care | Payer: BC Managed Care – PPO | Source: Ambulatory Visit | Attending: Obstetrics and Gynecology | Admitting: Obstetrics and Gynecology

## 2021-05-16 DIAGNOSIS — Z1231 Encounter for screening mammogram for malignant neoplasm of breast: Secondary | ICD-10-CM | POA: Diagnosis not present

## 2022-05-02 ENCOUNTER — Other Ambulatory Visit: Payer: Self-pay | Admitting: Obstetrics and Gynecology

## 2022-05-02 DIAGNOSIS — Z1231 Encounter for screening mammogram for malignant neoplasm of breast: Secondary | ICD-10-CM

## 2022-06-04 ENCOUNTER — Ambulatory Visit
Admission: RE | Admit: 2022-06-04 | Discharge: 2022-06-04 | Disposition: A | Discharge status: Home / Self Care | Payer: 59 | Source: Ambulatory Visit | Attending: Obstetrics and Gynecology | Admitting: Obstetrics and Gynecology

## 2022-06-04 DIAGNOSIS — Z1231 Encounter for screening mammogram for malignant neoplasm of breast: Secondary | ICD-10-CM

## 2022-06-05 ENCOUNTER — Telehealth: Payer: Self-pay

## 2022-06-05 NOTE — Telephone Encounter (Signed)
This order needs to be routed to Dr. Harold Hedge, her GYN MD (the order is under his name in EPIC already). She has not been seen here since 2017--Dr. Kuneff.

## 2022-06-05 NOTE — Telephone Encounter (Signed)
[facility] faxed over a form regarding recommended imaging. Provider signature required.  Placed on PCP desk to review and sign, if appropriate.

## 2022-07-01 ENCOUNTER — Other Ambulatory Visit: Payer: Self-pay | Admitting: Obstetrics and Gynecology

## 2022-07-01 DIAGNOSIS — N644 Mastodynia: Secondary | ICD-10-CM

## 2022-07-09 ENCOUNTER — Encounter: Payer: Self-pay | Admitting: Obstetrics and Gynecology

## 2022-07-09 ENCOUNTER — Ambulatory Visit
Admission: RE | Admit: 2022-07-09 | Discharge: 2022-07-09 | Disposition: A | Discharge status: Home / Self Care | Payer: 59 | Source: Ambulatory Visit | Attending: Obstetrics and Gynecology | Admitting: Obstetrics and Gynecology

## 2022-07-09 ENCOUNTER — Other Ambulatory Visit: Payer: Self-pay | Admitting: Obstetrics and Gynecology

## 2022-07-09 DIAGNOSIS — N644 Mastodynia: Secondary | ICD-10-CM

## 2023-02-09 ENCOUNTER — Encounter: Payer: Self-pay | Admitting: Gastroenterology
